# Patient Record
Sex: Male | Born: 1950 | Race: Black or African American | Hispanic: No | Marital: Married | Smoking: Former smoker
Health system: Southern US, Community
[De-identification: ages and names within clinical notes are randomized; demographics above are authoritative.]

## PROBLEM LIST (undated history)

## (undated) DIAGNOSIS — J45909 Unspecified asthma, uncomplicated: Secondary | ICD-10-CM

## (undated) DIAGNOSIS — E785 Hyperlipidemia, unspecified: Secondary | ICD-10-CM

## (undated) DIAGNOSIS — R002 Palpitations: Secondary | ICD-10-CM

## (undated) DIAGNOSIS — Z87442 Personal history of urinary calculi: Secondary | ICD-10-CM

## (undated) DIAGNOSIS — N189 Chronic kidney disease, unspecified: Secondary | ICD-10-CM

## (undated) DIAGNOSIS — K219 Gastro-esophageal reflux disease without esophagitis: Secondary | ICD-10-CM

## (undated) DIAGNOSIS — M1990 Unspecified osteoarthritis, unspecified site: Secondary | ICD-10-CM

## (undated) DIAGNOSIS — I10 Essential (primary) hypertension: Secondary | ICD-10-CM

## (undated) HISTORY — DX: Gastro-esophageal reflux disease without esophagitis: K21.9

## (undated) HISTORY — DX: Hyperlipidemia, unspecified: E78.5

## (undated) HISTORY — PX: TONSILLECTOMY: SUR1361

## (undated) HISTORY — PX: TONSILLECTOMY: SHX5217

## (undated) HISTORY — DX: Palpitations: R00.2

---

## 1998-12-16 ENCOUNTER — Encounter: Admission: RE | Admit: 1998-12-16 | Discharge: 1998-12-16 | Payer: Self-pay | Admitting: Gastroenterology

## 1998-12-30 ENCOUNTER — Encounter: Payer: Self-pay | Admitting: *Deleted

## 1998-12-30 ENCOUNTER — Encounter: Admission: RE | Admit: 1998-12-30 | Discharge: 1998-12-30 | Payer: Self-pay | Admitting: Pediatrics

## 1999-02-01 ENCOUNTER — Encounter: Payer: Self-pay | Admitting: *Deleted

## 1999-02-03 ENCOUNTER — Ambulatory Visit (HOSPITAL_COMMUNITY): Admission: RE | Admit: 1999-02-03 | Discharge: 1999-02-03 | Payer: Self-pay | Admitting: *Deleted

## 1999-02-03 ENCOUNTER — Encounter (INDEPENDENT_AMBULATORY_CARE_PROVIDER_SITE_OTHER): Payer: Self-pay | Admitting: Specialist

## 2000-07-04 ENCOUNTER — Encounter: Admission: RE | Admit: 2000-07-04 | Discharge: 2000-07-04 | Payer: Self-pay | Admitting: *Deleted

## 2000-07-04 ENCOUNTER — Encounter: Payer: Self-pay | Admitting: *Deleted

## 2000-07-05 ENCOUNTER — Encounter (INDEPENDENT_AMBULATORY_CARE_PROVIDER_SITE_OTHER): Payer: Self-pay | Admitting: Specialist

## 2000-07-05 ENCOUNTER — Ambulatory Visit (HOSPITAL_COMMUNITY): Admission: RE | Admit: 2000-07-05 | Discharge: 2000-07-05 | Payer: Self-pay | Admitting: *Deleted

## 2000-07-05 ENCOUNTER — Encounter: Payer: Self-pay | Admitting: *Deleted

## 2000-07-11 ENCOUNTER — Encounter: Payer: Self-pay | Admitting: *Deleted

## 2000-07-11 ENCOUNTER — Ambulatory Visit (HOSPITAL_COMMUNITY): Admission: RE | Admit: 2000-07-11 | Discharge: 2000-07-11 | Payer: Self-pay | Admitting: *Deleted

## 2000-07-22 ENCOUNTER — Encounter: Admission: RE | Admit: 2000-07-22 | Discharge: 2000-07-22 | Payer: Self-pay | Admitting: *Deleted

## 2000-07-22 ENCOUNTER — Encounter: Payer: Self-pay | Admitting: *Deleted

## 2000-07-30 ENCOUNTER — Encounter: Admission: RE | Admit: 2000-07-30 | Discharge: 2000-07-30 | Payer: Self-pay | Admitting: *Deleted

## 2000-07-30 ENCOUNTER — Encounter: Payer: Self-pay | Admitting: *Deleted

## 2000-11-20 ENCOUNTER — Emergency Department (HOSPITAL_COMMUNITY): Admission: EM | Admit: 2000-11-20 | Discharge: 2000-11-20 | Payer: Self-pay | Admitting: Emergency Medicine

## 2001-01-02 ENCOUNTER — Ambulatory Visit (HOSPITAL_COMMUNITY): Admission: RE | Admit: 2001-01-02 | Discharge: 2001-01-02 | Payer: Self-pay | Admitting: Family Medicine

## 2001-01-02 ENCOUNTER — Encounter: Payer: Self-pay | Admitting: Family Medicine

## 2002-05-06 ENCOUNTER — Ambulatory Visit (HOSPITAL_COMMUNITY): Admission: RE | Admit: 2002-05-06 | Discharge: 2002-05-06 | Payer: Self-pay | Admitting: Family Medicine

## 2002-05-06 ENCOUNTER — Encounter: Payer: Self-pay | Admitting: Family Medicine

## 2002-08-26 ENCOUNTER — Encounter (INDEPENDENT_AMBULATORY_CARE_PROVIDER_SITE_OTHER): Payer: Self-pay | Admitting: Specialist

## 2002-08-26 ENCOUNTER — Ambulatory Visit (HOSPITAL_COMMUNITY): Admission: RE | Admit: 2002-08-26 | Discharge: 2002-08-26 | Payer: Self-pay | Admitting: Gastroenterology

## 2002-12-21 ENCOUNTER — Encounter: Payer: Self-pay | Admitting: Gastroenterology

## 2002-12-21 ENCOUNTER — Ambulatory Visit (HOSPITAL_COMMUNITY): Admission: RE | Admit: 2002-12-21 | Discharge: 2002-12-21 | Payer: Self-pay | Admitting: Gastroenterology

## 2003-02-10 ENCOUNTER — Encounter: Admission: RE | Admit: 2003-02-10 | Discharge: 2003-02-10 | Payer: Self-pay | Admitting: Cardiology

## 2003-02-16 ENCOUNTER — Encounter: Admission: RE | Admit: 2003-02-16 | Discharge: 2003-02-16 | Payer: Self-pay | Admitting: Cardiology

## 2003-04-21 ENCOUNTER — Encounter (HOSPITAL_COMMUNITY): Admission: RE | Admit: 2003-04-21 | Discharge: 2003-05-21 | Payer: Self-pay | Admitting: Preventative Medicine

## 2004-03-30 ENCOUNTER — Ambulatory Visit (HOSPITAL_COMMUNITY): Admission: RE | Admit: 2004-03-30 | Discharge: 2004-03-30 | Payer: Self-pay | Admitting: Family Medicine

## 2005-11-16 ENCOUNTER — Ambulatory Visit (HOSPITAL_BASED_OUTPATIENT_CLINIC_OR_DEPARTMENT_OTHER): Admission: RE | Admit: 2005-11-16 | Discharge: 2005-11-16 | Payer: Self-pay | Admitting: Urology

## 2005-11-16 ENCOUNTER — Encounter (INDEPENDENT_AMBULATORY_CARE_PROVIDER_SITE_OTHER): Payer: Self-pay | Admitting: Specialist

## 2008-02-13 ENCOUNTER — Emergency Department (HOSPITAL_COMMUNITY): Admission: EM | Admit: 2008-02-13 | Discharge: 2008-02-13 | Payer: Self-pay | Admitting: Emergency Medicine

## 2009-03-05 HISTORY — PX: CYSTOSCOPY: SUR368

## 2009-11-10 ENCOUNTER — Encounter: Admission: RE | Admit: 2009-11-10 | Discharge: 2009-11-10 | Payer: Self-pay | Admitting: Otolaryngology

## 2010-01-10 ENCOUNTER — Ambulatory Visit (HOSPITAL_BASED_OUTPATIENT_CLINIC_OR_DEPARTMENT_OTHER): Admission: RE | Admit: 2010-01-10 | Discharge: 2010-01-10 | Payer: Self-pay | Admitting: Urology

## 2010-03-25 ENCOUNTER — Encounter: Payer: Self-pay | Admitting: Cardiology

## 2010-05-16 LAB — POCT I-STAT 4, (NA,K, GLUC, HGB,HCT)
Glucose, Bld: 125 mg/dL — ABNORMAL HIGH (ref 70–99)
HCT: 41 % (ref 39.0–52.0)
Potassium: 3.7 mEq/L (ref 3.5–5.1)
Sodium: 139 mEq/L (ref 135–145)

## 2010-07-21 NOTE — Op Note (Signed)
   NAME:  Reginald Stephens, Reginald Stephens                          ACCOUNT NO.:  [id number]   MEDICAL RECORD NO.:  [id number]                   PATIENT TYPE:  AMB   LOCATION:  ENDO                                 FACILITY:  [facility]   PHYSICIAN:  John C. Madilyn Fireman, M.D.                 DATE OF BIRTH:  [birth year]   DATE OF PROCEDURE:  08/26/2002  DATE OF DISCHARGE:                                 OPERATIVE REPORT   PROCEDURE:  Esophagogastroduodenoscopy.   INDICATION FOR PROCEDURE:  Weight loss, atypical reflux symptoms, and  continued dyspepsia.   PROCEDURE:  The patient was placed in the left lateral decubitus position  and placed on the pulse monitor with continuous low-flow oxygen delivered by  nasal cannula.  He was sedated with 75 mcg IV fentanyl and 6 mg IV Versed.  The Olympus video endoscope was advanced under direct vision into the  oropharynx and the esophagus.  The esophagus was straight and of normal  caliber with the squamocolumnar line at 38 cm.  There was no visible hiatal  hernia, ring, stricture, or other abnormality at the GE junction.  The  stomach was entered and a small amount of liquid secretions was suctioned  from the fundus.  Retroflexed view of the cardia was unremarkable.  The  fundus, body, antrum, and pylorus all appeared normal.  The duodenum was  entered and both the bulb and second portion were well-inspected and  appeared to be within normal limits.  Small bowel biopsies were taken to  rule out celiac disease.  The scope was withdrawn back into the antrum and a  CLOtest obtained.  The scope was then withdrawn and the patient returned to  the recovery room in stable condition.  He tolerated the procedure well, and  there were no immediate complications.   IMPRESSION:  Normal endoscopy.   PLAN:  Await CLOtest and small bowel biopsies, and will proceed with  colonoscopy.                                               John C. Madilyn Fireman, M.D.    JCH/MEDQ  D:  08/26/2002  T:   08/26/2002  Job:  [id number]

## 2010-07-21 NOTE — Op Note (Signed)
NAMEARLINGTON, Reginald Stephens                ACCOUNT NO.:  [id number]   MEDICAL RECORD NO.:  [id number]          PATIENT TYPE:  AMB   LOCATION:  NESC                         FACILITY:  [facility]   PHYSICIAN:  Lindaann Slough, M.D.  DATE OF BIRTH:  05/11/1950   DATE OF PROCEDURE:  11/16/2005  DATE OF DISCHARGE:                                 OPERATIVE REPORT   PREOPERATIVE DIAGNOSIS:  Hematuria.   POSTPROCEDURE DIAGNOSIS:  Hematuria.   PROCEDURE DONE:  Cystoscopy.   INDICATION:  The patient is a 60 year old male who has a history of urethral  stricture.  He was found on urinalysis to have microhematuria.  He had  cystoscopy and dilation in the past.  He is scheduled today for cystoscopy.  The patient did not want to have the procedure under local anesthesia.   Under general anesthesia, the patient was prepped and draped and placed in  the dorsal lithotomy position.  A #22 Wappler cystoscope was inserted in the  bladder.  The urethra is normal.  There is no stone or tumor in the bladder.  The ureteral orifices are in normal position and shape with clear efflux.  The bladder was irrigated with normal saline and bladder washings were sent  for cytology.  The cystoscope was then removed.   He tolerated the procedure well.      Lindaann Slough, M.D.  Electronically Signed     MN/MEDQ  D:  11/16/2005  T:  11/17/2005  Job:  [id number]

## 2010-07-21 NOTE — H&P (Signed)
[facility]  Patient:    Reginald Stephens, Reginald Stephens                       MRN: [id number] Adm. Date:  [id number] Attending:  Dalbert Mayotte CC:         Radene Knee., M.D. (office)  [facility]   History and Physical  CHIEF COMPLAINT: This 60 year old male is scheduled for shock wave lithotripsy on Jul 11, 2000 at [facility] at 0900 hours, with a chief complaint of blockage to the left kidney.  HISTORY OF PRESENT ILLNESS: This 60 year old male has been followed in our office since 1989 when he was initially seen with prostatitis and hematuria, and found to have severe posterior urethritis.  The patient was treated with antibiotics and did well until 1998 when he again had discoloration of his urine and was getting up several times a night.  Investigation at that time revealed that he had a 9 mm calculus in the left lower calix, nonobstructing. He had cystoscopy at that time which suggested hemorrhages and glomerulations in the bladder typical of interstitial cystitis.  The patient had Clorpactin and hydrodistention and this was repeated in December 2000, and the patient has done well clinically.  He came to the office for a routine follow-up check on Jul 04, 2000 and ultrasound revealed a grade 2 left hydronephrosis.  His urine showed slight increase in white blood cells, 5-10/hpf, with 1-2 rbc/hpf. He had no significant residual urine and no hydronephrosis on the right.  The patient had a cystoscopy and insertion of a left ureteral stent and retrograde pyelogram performed at [facility] on Jul 05, 2000 and this confirmed a 9-10 mm calculus obstructing the upper left ureter with grade 2 hydronephrosis.  The stent was in good position.  The patient was allowed to go home on Tylox and Cipro and scheduled for lithotripsy on Jul 11, 2000.  ALLERGIES: He thinks he is allergic to shellfish and possibly IODIDES,  no other drugs.  CURRENT MEDICATIONS:  1. Prinivil 20 mg q.d.  2. Cartia XT 300 mg q.d.  3. Glucotrol and Glucophage, which he was advised to stop.  PAST MEDICAL/SURGICAL HISTORY:  1. Asthma.  2. Diabetes mellitus.  3. Cystoscopy in 1989, 1998, and December 2000 with hydrodistention and IVP     in October 2000 when calculus was additionally noted.  4. CT scan performed on Jul 04, 2000 at [facility] confirmed     hydronephrosis and stone obstructing the left upper ureter.  He has had no major operations.  REVIEW OF SYSTEMS: General health has been good.  Weight has been stable. HEENT: Unremarkable.  He denies any headaches.  CARDIORESPIRATORY: He does have asthma, which is not bothering him at the present time.  He is under the care of [facility] for this.  He has borderline cardiomegaly on his chest x-ray of Jul 05, 2000.  The patient denies any chest pain, heart attack.  ENDOCRINE/GI: He has been diagnosed with adult onset diabetes as well as gastroesophageal reflux.  He has no history of hepatitis or peptic ulcer disease.  BONES/JOINTS/MUSCLES: Unremarkable.  NEUROLOGIC/PSYCHIATRIC: Unremarkable.  FAMILY HISTORY: Mother and father living and well, in their 6s.  No diabetes, stones, or bleeds in the family to his knowledge.  However, there is a family history of hypertension.  SOCIAL HISTORY: The patient is married.  He does not abuse  alcohol or tobacco. He is employed by [employer].  PHYSICAL EXAMINATION:  GENERAL: Well-developed, well-nourished 60 year old male.  VITAL SIGNS: Blood pressure 130/90, temperature 98.5 degrees, pulse 68, respirations 12.  HEENT: Ears and tympanic membranes unremarkable.  PERRLA.  EOMI.  Pharynx benign.  Teeth in good repair.  NECK: No enlargement of thyroid, no nodes palpable.  CHEST: Clear to auscultation and percussion.  HEART: Normal sinus rhythm, no murmur detected.  ABDOMEN: Obese.  Liver,  spleen, and kidneys - masses, tenderness, hernia not detected.  GU: The meatus is normal.  The penis is uncircumcised.  The left testis is normal size and consistency and the right testis is small but normal consistency.  No tenderness.  Scrotum, anus, and perineum normal.  Rectal tone good.  Prostate 20 g, symmetric, smooth, soft, nontender.  EXTREMITIES: No edema, good peripheral pulses.  NEUROLOGIC: Grossly normal reflexes and sensation.  IMPRESSION:  1. Left upper ureteral calculus, 9 mm.  2. Interstitial cystitis.  3. Prostatitis and benign prostatic hypertrophy.  4. Asthma.  5. Prostatic specific antigen 9.6.  6. Diabetes mellitus.  PLAN: The plan is for shock wave lithotripsy on Jul 11, 2000.  He has Tylox for pain, Cipro 500 mg b.i.d. for prophylaxis against infection. DD:  07/05/00 TD:  07/07/00 Job: [id number]

## 2010-07-21 NOTE — Op Note (Signed)
[facility]  Patient:    Reginald Stephens, Reginald Stephens                       MRN: [id number] Proc. Date: 07/05/00 Adm. Date:  [id number] Attending:  Dalbert Mayotte CC:         [facility]   Operative Report  PREOPERATIVE DIAGNOSES: 1. Left ureteral calculus with hydronephrosis. 2. Hypertension. 3. Interstitial cystitis.  POSTOPERATIVE DIAGNOSES: 1. Left ureteral calculus with hydronephrosis. 2. Hypertension. 3. Interstitial cystitis.  OPERATION PERFORMED:  Cystoscopy and insertion of a left ureteral stent (#6 Kwort).  DESCRIPTION OF PROCEDURE:  This 60 year old male was brought to the operating room and underwent successful induction of general anesthesia after receiving IV Cipro.  The patient was prepared and draped in the usual fashion in the lithotomy position.  The bladder was inspected with a #22 cystourethroscope using 70 and 12 degree lenses.  The patients bladder was filled to capacity. No stones, tumor, or ulcer was noted.  He had a mild hyperemic erythema initially.  Following decompression of the bladder, he developed hemorrhages that were submucosal and petechial but no cracking or bleeding.  His capacity was approximately 700 mL.  The patients ureteral orifices appeared normal, and the left ureteral orifice was intubated with a #5 open-ended catheter using an .038 Glidewire which was passed up to the stone in the upper left ureter.  Over the Guidewire was passed the open-ended catheter and with some manipulation, the Guidewire was passed beyond the stone and up into the kidney and the open-ended catheter advanced over the Guidewire.  A retrograde pyelogram was performed which revealed a grade 2 dilatation of the pelvis and calices on the left.  The open-ended catheter was then used to reintroduce the .038 Guidewire, and over the .038 Guidewire was passed a #6 Kwort double-J ureteral stent which was noted to be in good position.   A C-arm fluoroscopy in the kidney, and the distal end was noted to have a good curl by cystoscopy.  A pull-string was taped to the penis, and the patient returned to the recovery area in stable condition.  The plan is for the patient to go home.  We will give him Tylox for pain if he needs it.  We will give him Cipro 500 mg b.i.d. for prophylaxis against infection.  We will have him return to the office on Monday for scheduling of shock wave lithotripsy next week. DD:  07/05/00 TD:  07/05/00 Job: [id number]

## 2010-07-21 NOTE — Op Note (Signed)
   NAME:  Reginald Stephens, Reginald Stephens                          ACCOUNT NO.:  [id number]   MEDICAL RECORD NO.:  [id number]                   PATIENT TYPE:  AMB   LOCATION:  ENDO                                 [facility]ACILITY:  [facility]   PHYSICIAN:  John C. Madilyn Fireman, M.D.                 DATE OF BIRTH:  11/04/1950   DATE OF PROCEDURE:  08/26/2002  DATE OF DISCHARGE:                                 OPERATIVE REPORT   PROCEDURE:  Colonoscopy.   INDICATION FOR PROCEDURE:  Polyp seen on sigmoidoscopy.   PROCEDURE:  The patient was placed in the left lateral decubitus position  and placed on the pulse monitor with continuous low-flow oxygen delivered by  nasal cannula.  He was sedated with 25 mg IV fentanyl and 1.5 mg IV Versed  in addition to the medicine received for the previous EGD.  The Olympus  pediatric colonoscope was inserted into the rectum and advanced to its  entire length.  However, despite multiple torquing maneuvers, abdominal  pressure, and position changes, I could not reach the cecum with the scope  fully inserted.  The scope was withdrawn and replaced with a standard adult  colonoscope with the same result.  It was not clear but felt that the point  of most proximal visualization was somewhere around the hepatic flexure.  The prep was good.  The most proximal areas examined appeared normal, as did  the transverse, descending, and sigmoid colon.  There were no masses,  polyps, diverticula, or other mucosal abnormalities seen.  At about 15 cm  there was a 6-8 mm sessile polyp, which was fulgurated by hot biopsy, but  otherwise the rectum appeared normal.  The scope was then withdrawn and the  patient returned to the recovery room in stable condition.  He tolerated the  procedure well, and there were no immediate complications.   IMPRESSION:  Small rectal polyp, otherwise normal study with inability to  reach the cecum and parts of the ascending colon.   PLAN:  Await biopsy results to determine  method and interval for future  colon screening.                                               John C. Madilyn Fireman, M.D.    JCH/MEDQ  D:  08/26/2002  T:  08/26/2002  Job:  [id number]

## 2010-07-21 NOTE — H&P (Signed)
[facility]  Patient:    Reginald Stephens, Reginald Stephens                      MRN: [id number] Attending:  Radene Knee., M.D.                         History and Physical  REASON FOR ADMISSION:  This 60 year old male is scheduled at [facility] on Jul 05, 2000 for cystoscopy and insertion of a left ureteral stent for relief of hydronephrosis secondary to ureteral calculus.  PRESENT ILLNESS:  This 60 year old male has been followed in our office at irregular intervals since 1989 and recently came to the office on Jul 04, 2000 for routine followup visit and investigation revealed that he had 5 to 10 wbcs in the urine, 1 to 2 rbcs and ultrasound suggested a hydronephrosis on the left, which had not been present previously.  He was known, however, to have had a 10-mm calculus in the left kidney on previous studies and presumably this has moved into the upper ureter.  He is scheduled for x-rays and blood work in preparation for insertion of stent and preparation for ESWL. The patients present illness reveals that since 1993, he has had microscopic hematuria, he has had some difficulties with erections, gets up infrequently at night, has not seen any gross blood, gravel or stone, but in 1998, he did have hematuria and cystoscopy revealed glomerulations and submucosal hemorrhages, with a capacity of 500 to 700 cc, suggesting interstitial cystitis-type changes.  The cytologies at that time were benign and he has done well since his last CYSTO and hydrodistention, December 2000; at that time, his capacity was 700 cc.  ALLERGIES:  No known drug allergies.  PRESENT MEDICATIONS:  Viagra 50 to 100 mg before intercourse.  He takes Glucophage, Glucotrol, Cartia XT and a fluid pill, probably HCTZ.  PAST MEDICAL AND SURGICAL HISTORY:  He does have asthma, obesity and has had no major operations.  He had the cystoscopies in 1989, 1998 and in December 2000.  His IVP, October 2000,  revealed a 9-mm calculus in the left lower calyx with no obstruction at that time.  REVIEW OF SYSTEMS:  General health has been fairly good.  Weight has been an increasing problem.  HEENT:  Denies any headaches or problems with his eyes or ears.  CARDIORESPIRATORY:  He does have asthma, which is not bothering him at the present time.  He denies any chest pain or heart attack.  GI:  He has been diagnosed with adult-onset diabetes mellitus.  No hepatitis.  No peptic ulcer disease.  BONES, JOINTS AND MUSCLES:  Unremarkable.  NEUROPSYCHIATRIC: Unremarkable.  FAMILY HISTORY:  Mother and father are living and well in their early 26s. There is no diabetes, stones, bleeders in the family; there is, however, a family history of hypertension.  SOCIAL HISTORY:  The patient is married and does not abuse alcohol or tobacco. 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  PHYSICAL EXAMINATION:  GENERAL:  Physical examination reveals a well-developed, well-nourished 60 year old male in no acute distress.  VITAL SIGNS:  The temperature is 98.5, the pulse 68, the respirations 12, the blood pressure 130/90.  HEENT:  The ears and tympanic membranes are unremarkable.  Eyes react normally to light and accommodation.  Extraocular movements intact.  Pharynx benign. Teeth in good repair.  NECK:  No enlargement of the thyroid.  No nodes are palpable.  CHEST:  Clear to percussion and auscultation.  HEART:  Normal sinus rhythm.  No murmur detected.  ABDOMEN:  Markedly obese.  No masses or tenderness.  Liver, kidney, spleen, hernia not detected.  GENITALIA:  The penis is normal, uncircumcised, meatus normal.  The left testis is large.  The right testis is small.  Neither one is tender, good consistency.  Scrotum, anus, perineum normal.  RECTAL:  Rectal tone good.  Prostate is 20 g, symmetrical, smooth, soft.  EXTREMITIES:  No edema.  Good peripheral  pulses.  NEUROLOGIC:  Grossly normal reflexes and sensation.  IMPRESSION: 1. Left hydronephrosis, grade 2, secondary to ureteral calculus. 2. Prostatitis and benign prostatic hypertrophy. 3. Interstitial cystitis, quiescent. 4. Asthma. 5. Urinary tract infection, culture pending.  PLAN:  Cipro 500 mg b.i.d., CT stone protocol, CBC, CMET, PT, PTT and PSA. DD:  07/04/00 TD:  07/05/00 Job: [id number]

## 2010-11-23 ENCOUNTER — Telehealth (INDEPENDENT_AMBULATORY_CARE_PROVIDER_SITE_OTHER): Payer: Self-pay | Admitting: *Deleted

## 2010-11-23 NOTE — Telephone Encounter (Signed)
TCS sch'd 02/15/11 @ 8:30 (9:30), hold evening & morning dose of Metformin, no change to glipizide, movi prep instructions mailed

## 2010-12-08 LAB — BASIC METABOLIC PANEL
BUN: 16 mg/dL (ref 6–23)
CO2: 27 mEq/L (ref 19–32)
Chloride: 93 mEq/L — ABNORMAL LOW (ref 96–112)
Creatinine, Ser: 0.99 mg/dL (ref 0.4–1.5)
Glucose, Bld: 182 mg/dL — ABNORMAL HIGH (ref 70–99)
Potassium: 3.3 mEq/L — ABNORMAL LOW (ref 3.5–5.1)

## 2010-12-08 LAB — DIFFERENTIAL
Basophils Relative: 1 % (ref 0–1)
Eosinophils Absolute: 0 10*3/uL (ref 0.0–0.7)
Eosinophils Relative: 0 % (ref 0–5)
Monocytes Relative: 11 % (ref 3–12)
Neutrophils Relative %: 66 % (ref 43–77)

## 2010-12-08 LAB — CBC
HCT: 47 % (ref 39.0–52.0)
MCHC: 33.6 g/dL (ref 30.0–36.0)
MCV: 98.7 fL (ref 78.0–100.0)
Platelets: 396 10*3/uL (ref 150–400)

## 2010-12-08 LAB — GLUCOSE, CAPILLARY: Glucose-Capillary: 99 mg/dL (ref 70–99)

## 2010-12-14 ENCOUNTER — Encounter (INDEPENDENT_AMBULATORY_CARE_PROVIDER_SITE_OTHER): Payer: Self-pay | Admitting: Internal Medicine

## 2011-01-09 ENCOUNTER — Other Ambulatory Visit (INDEPENDENT_AMBULATORY_CARE_PROVIDER_SITE_OTHER): Payer: Self-pay | Admitting: *Deleted

## 2011-01-09 DIAGNOSIS — Z1211 Encounter for screening for malignant neoplasm of colon: Secondary | ICD-10-CM

## 2011-02-01 ENCOUNTER — Telehealth (INDEPENDENT_AMBULATORY_CARE_PROVIDER_SITE_OTHER): Payer: Self-pay | Admitting: *Deleted

## 2011-02-01 NOTE — Telephone Encounter (Signed)
PCP/Requesting MD:    Name: Reginald Stephens  DOB: 11/21/1950  Home Phone: [phone]      Procedure: TCS  Reason/Indication:  SCREENING  Has patient had this procedure before?  YES  If so, when, by whom and where?  68 YRS AGO  Is there a family history of colon cancer?  NO  Who?  What age when diagnosed?    Is patient diabetic?   YES      Does patient have prosthetic heart valve?  NO  Do you have a pacemaker?  NO  Has patient had joint replacement within last 12 months?  NO  Is patient on Coumadin, Plavix and/or Aspirin? YES  Medications: ASA 81 MG DAILY, DILTIAZEM 300 MG DAILY, METFORMIN 500 MG BID, GLIPIZIDE 2.5 MG DAILY, LISINOPRIL/HCTZ 20/25 MG DAILY, LIPITOR 20 MG DAILY, PRILOSEC DAILY, ZINC 150 MG DAILY, BENADRYL NIGHTLY  Allergies: NKDA  Pharmacy:   Medication Adjustment: ASA 2 DAYS,  HOLD EVENING & MORNING DOSE OF METFORMIN PER TERRI  Procedure date & time: 02/15/11 @ 8:30

## 2011-02-05 NOTE — Telephone Encounter (Signed)
agree

## 2011-02-13 ENCOUNTER — Encounter (HOSPITAL_COMMUNITY): Payer: Self-pay | Admitting: Pharmacy Technician

## 2011-02-14 MED ORDER — SODIUM CHLORIDE 0.45 % IV SOLN
Freq: Once | INTRAVENOUS | Status: AC
  Administered 2011-02-15: 1000 mL via INTRAVENOUS

## 2011-02-15 ENCOUNTER — Encounter (HOSPITAL_COMMUNITY)
Admission: RE | Disposition: A | Discharge status: Home / Self Care | Payer: Self-pay | Source: Ambulatory Visit | Attending: Internal Medicine

## 2011-02-15 ENCOUNTER — Ambulatory Visit (HOSPITAL_COMMUNITY)
Admission: RE | Admit: 2011-02-15 | Discharge: 2011-02-15 | Disposition: A | Discharge status: Home / Self Care | Payer: BC Managed Care – PPO | Source: Ambulatory Visit | Attending: Internal Medicine | Admitting: Internal Medicine

## 2011-02-15 ENCOUNTER — Other Ambulatory Visit (INDEPENDENT_AMBULATORY_CARE_PROVIDER_SITE_OTHER): Payer: Self-pay | Admitting: Internal Medicine

## 2011-02-15 ENCOUNTER — Encounter (HOSPITAL_COMMUNITY): Payer: Self-pay | Admitting: *Deleted

## 2011-02-15 DIAGNOSIS — D126 Benign neoplasm of colon, unspecified: Secondary | ICD-10-CM

## 2011-02-15 DIAGNOSIS — E119 Type 2 diabetes mellitus without complications: Secondary | ICD-10-CM | POA: Insufficient documentation

## 2011-02-15 DIAGNOSIS — K5730 Diverticulosis of large intestine without perforation or abscess without bleeding: Secondary | ICD-10-CM | POA: Insufficient documentation

## 2011-02-15 DIAGNOSIS — Z79899 Other long term (current) drug therapy: Secondary | ICD-10-CM | POA: Insufficient documentation

## 2011-02-15 DIAGNOSIS — I10 Essential (primary) hypertension: Secondary | ICD-10-CM | POA: Insufficient documentation

## 2011-02-15 DIAGNOSIS — Z1211 Encounter for screening for malignant neoplasm of colon: Secondary | ICD-10-CM

## 2011-02-15 HISTORY — DX: Chronic kidney disease, unspecified: N18.9

## 2011-02-15 HISTORY — DX: Unspecified osteoarthritis, unspecified site: M19.90

## 2011-02-15 HISTORY — PX: COLONOSCOPY: SHX5424

## 2011-02-15 HISTORY — DX: Essential (primary) hypertension: I10

## 2011-02-15 LAB — GLUCOSE, CAPILLARY: Glucose-Capillary: 135 mg/dL — ABNORMAL HIGH (ref 70–99)

## 2011-02-15 SURGERY — COLONOSCOPY
Anesthesia: Moderate Sedation

## 2011-02-15 MED ORDER — MIDAZOLAM HCL 5 MG/5ML IJ SOLN
INTRAMUSCULAR | Status: DC | PRN
  Administered 2011-02-15: 2 mg via INTRAVENOUS

## 2011-02-15 MED ORDER — MEPERIDINE HCL 50 MG/ML IJ SOLN
INTRAMUSCULAR | Status: AC
  Filled 2011-02-15: qty 1

## 2011-02-15 MED ORDER — MIDAZOLAM HCL 5 MG/5ML IJ SOLN
INTRAMUSCULAR | Status: AC
  Filled 2011-02-15: qty 10

## 2011-02-15 MED ORDER — MEPERIDINE HCL 25 MG/ML IJ SOLN
INTRAMUSCULAR | Status: DC | PRN
  Administered 2011-02-15: 25 mg via INTRAVENOUS

## 2011-02-15 MED ORDER — MEPERIDINE HCL 50 MG/ML IJ SOLN
INTRAMUSCULAR | Status: DC | PRN
  Administered 2011-02-15: 25 mg via INTRAVENOUS

## 2011-02-15 MED ORDER — STERILE WATER FOR IRRIGATION IR SOLN
Status: DC | PRN
  Administered 2011-02-15: 08:00:00

## 2011-02-15 NOTE — Op Note (Signed)
COLONOSCOPY PROCEDURE REPORT  PATIENT:  Reginald Stephens  MR#:  [id number] Birthdate:  March 02, 1951, 60 y.o., male Endoscopist:  Dr. Malissa Hippo, MD Referred By:  Lenise Herald, [facility] Procedure Date: 02/15/2011  Procedure:   Colonoscopy  Indications: Patient is 60 year old African American male who is here for average risk screening colonoscopy.  Informed Consent:  Procedure and risks were reviewed with the patient and informed consent was obtained.  Medications:  Demerol 50 mg IV Versed 4 mg IV  Description of procedure:  After a digital rectal exam was performed, that colonoscope was advanced from the anus through the rectum and colon to the area of the cecum, ileocecal valve and appendiceal orifice. The cecum was deeply intubated. These structures were well-seen and photographed for the record. From the level of the cecum and ileocecal valve, the scope was slowly and cautiously withdrawn. The mucosal surfaces were carefully surveyed utilizing scope tip to flexion to facilitate fold flattening as needed. The scope was pulled down into the rectum where a thorough exam including retroflexion was performed.  Findings:   Prep satisfactory. Redundant colon with few small diverticula at sigmoid colon. Two small polyps at hepatic flexure ablated via cold biopsy and submitted in one container. Normal rectal mucosa and anorectal junction.  Therapeutic/Diagnostic Maneuvers Performed:  See above  Complications:  None  Cecal Withdrawal Time:  21 minutes  Impression:  Examination performed to cecum.. Two small polyps ablated via cold biopsy from hepatic flexure and submitted in one container. Mild sigmoid colon diverticulosis.  Recommendations:  Standard instructions given. I will be contacting patient with results of biopsy and further recommendations.  REHMAN,NAJEEB U  02/15/2011 9:08 AM  CC: Lenise Herald, PAC

## 2011-02-15 NOTE — H&P (Signed)
Reginald Stephens is an 60 y.o. male.   Chief Complaint: Patient is here for colonoscopy. HPI: Patient is 60 year old African male who is in for average risk screening colonoscopy. Patient's last exam was 10 years ago and was normal. He has good appetite. He denies abdominal pain melena or rectal bleeding he says this soreness his abdomen since taking second part of his prep. Family history is negative for CRC. Past Medical History  Diagnosis Date  . Diabetes mellitus   . Hypertension   . Arthritis   . Chronic kidney disease     Past Surgical History  Procedure Date  . Tonsillectomy     age 11  . Cystoscopy 2011  . Tonsillectomy     History reviewed. No pertinent family history. Social History:  reports that he has been smoking.  He does not have any smokeless tobacco history on file. He reports that he drinks about 7.2 ounces of alcohol per week. He reports that he does not use illicit drugs.  Allergies:  Allergies  Allergen Reactions  . Shellfish Allergy     Medications Prior to Admission  Medication Dose Route Frequency Provider Last Rate Last Dose  . 0.45 % sodium chloride infusion   Intravenous Once Malissa Hippo, MD 20 mL/hr at 02/15/11 0736 1,000 mL at 02/15/11 0736  . meperidine (DEMEROL) 50 MG/ML injection           . midazolam (VERSED) 5 MG/5ML injection           . simethicone susp in sterile water 1000 mL irrigation    PRN Malissa Hippo, MD       No current outpatient prescriptions on file as of 02/15/2011.    Results for orders placed during the hospital encounter of 02/15/11 (from the past 48 hour(s))  GLUCOSE, CAPILLARY     Status: Abnormal   Collection Time   02/15/11  7:12 AM      Component Value Range Comment   Glucose-Capillary 135 (*) 70 - 99 (mg/dL)    No results found.  Review of Systems  Constitutional: Negative for weight loss.  Gastrointestinal: Negative for abdominal pain, diarrhea, constipation, blood in stool and melena.    Blood  pressure 127/84, pulse 48, temperature 98.1 F (36.7 C), temperature source Oral, resp. rate 20, height 5\' 10"  (1.778 m), weight 226 lb (102.513 kg), SpO2 98.00%. Physical Exam  Constitutional: He appears well-developed and well-nourished.  HENT:  Mouth/Throat: Oropharynx is clear and moist.  Eyes: Conjunctivae are normal. No scleral icterus.  Neck: No thyromegaly present.  Cardiovascular: Normal rate, regular rhythm and normal heart sounds.   Respiratory: Effort normal and breath sounds normal.  GI: Soft. He exhibits no distension and no mass. There is no tenderness.  Musculoskeletal: He exhibits no edema.  Lymphadenopathy:    He has no cervical adenopathy.  Neurological: He is alert.  Skin: Skin is warm and dry.     Assessment/Plan Average risk screening colonoscopy.  Valerian Jewel U 02/15/2011, 8:28 AM

## 2011-02-23 ENCOUNTER — Encounter (INDEPENDENT_AMBULATORY_CARE_PROVIDER_SITE_OTHER): Payer: Self-pay | Admitting: *Deleted

## 2011-03-02 ENCOUNTER — Encounter (HOSPITAL_COMMUNITY): Payer: Self-pay | Admitting: Internal Medicine

## 2012-08-13 ENCOUNTER — Ambulatory Visit: Payer: BC Managed Care – PPO | Admitting: Cardiovascular Disease

## 2012-08-25 ENCOUNTER — Ambulatory Visit (INDEPENDENT_AMBULATORY_CARE_PROVIDER_SITE_OTHER): Payer: BC Managed Care – PPO | Admitting: Cardiovascular Disease

## 2012-08-25 ENCOUNTER — Encounter: Payer: Self-pay | Admitting: Cardiovascular Disease

## 2012-08-25 VITALS — BP 118/72 | HR 87 | Ht 70.0 in | Wt 225.0 lb

## 2012-08-25 DIAGNOSIS — Z720 Tobacco use: Secondary | ICD-10-CM | POA: Insufficient documentation

## 2012-08-25 DIAGNOSIS — E785 Hyperlipidemia, unspecified: Secondary | ICD-10-CM

## 2012-08-25 DIAGNOSIS — I10 Essential (primary) hypertension: Secondary | ICD-10-CM

## 2012-08-25 DIAGNOSIS — R002 Palpitations: Secondary | ICD-10-CM

## 2012-08-25 DIAGNOSIS — E119 Type 2 diabetes mellitus without complications: Secondary | ICD-10-CM

## 2012-08-25 DIAGNOSIS — F17200 Nicotine dependence, unspecified, uncomplicated: Secondary | ICD-10-CM

## 2012-08-25 NOTE — Assessment & Plan Note (Signed)
On statin drugs all by his primary care physician

## 2012-08-25 NOTE — Patient Instructions (Addendum)
Your physician wants you to follow-up as needed.  You will receive a reminder letter in the mail two months in advance. If you don't receive a letter, please call our office to schedule the follow-up appointment.  

## 2012-08-25 NOTE — Assessment & Plan Note (Signed)
I have counseled him about the importance of smoking cessation

## 2012-08-25 NOTE — Progress Notes (Signed)
08/25/2012 Reginald Stephens   [birth year]  [id number]  Primary Physician Lenise Herald, PA-C Primary Cardiologist: Runell Gess MD Roseanne Reno   HPI:  Reginald Stephens is a 62 year old moderately overweight married African American male father of 2, grandfather to 4 grandchildren referred by [facility] [employer] [facility] in retail for evaluation of an auscultated  Murmur.his cardiac risk factors are notable for diabetes, hypertension and hyperlipidemia. He does smoke one pack per day and has a 45-pack-year history of tobacco abuse. There is no family history of heart disease. He's never had a heart attack or stroke and denies chest pain or shortness of breath. He may have reactive airways disease. He saw a Surveyor, mining at [facility] medical who apparently auscultated a murmur referred him here for further evaluation.   Current Outpatient Prescriptions  Medication Sig Dispense Refill  . aspirin EC 81 MG tablet Take 81 mg by mouth daily.        Marland Kitchen atorvastatin (LIPITOR) 20 MG tablet Take 20 mg by mouth every morning.        . diltiazem (CARTIA XT) 300 MG 24 hr capsule Take 300 mg by mouth daily.        . diphenhydrAMINE (BENADRYL DYE-FREE ALLERGY) 25 mg capsule Take 25 mg by mouth at bedtime.        Marland Kitchen glipiZIDE (GLUCOTROL XL) 2.5 MG 24 hr tablet Take 2.5 mg by mouth daily.        Marland Kitchen lisinopril-hydrochlorothiazide (PRINZIDE,ZESTORETIC) 20-25 MG per tablet Take 1 tablet by mouth daily.       . metFORMIN (GLUCOPHAGE) 1000 MG tablet Take 1,000 mg by mouth 2 (two) times daily with a meal.        . Omeprazole Magnesium (PRILOSEC OTC PO) Take 20 mg by mouth daily.         No current facility-administered medications for this visit.    Allergies  Allergen Reactions  . Shellfish Allergy     History   Social History  . Marital Status: Married    Spouse Name: N/A    Number of Children: N/A  . Years of Education: N/A   Occupational History  . Not on file.   Social History  Main Topics  . Smoking status: Current Every Day Smoker -- 1.00 packs/day    Types: Cigarettes  . Smokeless tobacco: Not on file  . Alcohol Use: 7.2 oz/week    12 Cans of beer per week  . Drug Use: No  . Sexually Active: Not on file   Other Topics Concern  . Not on file   Social History Narrative  . No narrative on file     Review of Systems: General: negative for chills, fever, night sweats or weight changes.  Cardiovascular: negative for chest pain, dyspnea on exertion, edema, orthopnea, palpitations, paroxysmal nocturnal dyspnea or shortness of breath Dermatological: negative for rash Respiratory: negative for cough or wheezing Urologic: negative for hematuria Abdominal: negative for nausea, vomiting, diarrhea, bright red blood per rectum, melena, or hematemesis Neurologic: negative for visual changes, syncope, or dizziness All other systems reviewed and are otherwise negative except as noted above.    Blood pressure 118/72, pulse 87, height 5\' 10"  (1.778 m), weight 225 lb (102.059 kg).  General appearance: alert and no distress Neck: no adenopathy, no carotid bruit, no JVD, supple, symmetrical, trachea midline and thyroid not enlarged, symmetric, no tenderness/mass/nodules Lungs: clear to auscultation bilaterally Heart: regular rate and rhythm, S1, S2 normal, no murmur, click, rub or  gallop Abdomen: soft, non-tender; bowel sounds normal; no masses,  no organomegaly Extremities: extremities normal, atraumatic, no cyanosis or edema Pulses: 2+ and symmetric  EKG normal sinus rhythm at 87 without ST or T wave changes  ASSESSMENT AND PLAN:   Essential hypertension Under good control on current medications  Hyperlipidemia On statin drugs all by his primary care physician  Tobacco abuse I have counseled him about the importance of smoking cessation      Runell Gess MD [facility], [facility] 08/25/2012 10:56 AM

## 2012-08-25 NOTE — Assessment & Plan Note (Signed)
Under good control on current medications 

## 2013-05-10 ENCOUNTER — Emergency Department (HOSPITAL_COMMUNITY): Payer: BC Managed Care – PPO

## 2013-05-10 ENCOUNTER — Encounter (HOSPITAL_COMMUNITY): Payer: Self-pay | Admitting: Emergency Medicine

## 2013-05-10 ENCOUNTER — Emergency Department (HOSPITAL_COMMUNITY)
Admission: EM | Admit: 2013-05-10 | Discharge: 2013-05-10 | Disposition: A | Discharge status: Home / Self Care | Payer: BC Managed Care – PPO | Attending: Emergency Medicine | Admitting: Emergency Medicine

## 2013-05-10 DIAGNOSIS — M129 Arthropathy, unspecified: Secondary | ICD-10-CM | POA: Insufficient documentation

## 2013-05-10 DIAGNOSIS — IMO0002 Reserved for concepts with insufficient information to code with codable children: Secondary | ICD-10-CM | POA: Insufficient documentation

## 2013-05-10 DIAGNOSIS — F17200 Nicotine dependence, unspecified, uncomplicated: Secondary | ICD-10-CM | POA: Insufficient documentation

## 2013-05-10 DIAGNOSIS — Z79899 Other long term (current) drug therapy: Secondary | ICD-10-CM | POA: Insufficient documentation

## 2013-05-10 DIAGNOSIS — Z87442 Personal history of urinary calculi: Secondary | ICD-10-CM | POA: Insufficient documentation

## 2013-05-10 DIAGNOSIS — I129 Hypertensive chronic kidney disease with stage 1 through stage 4 chronic kidney disease, or unspecified chronic kidney disease: Secondary | ICD-10-CM | POA: Insufficient documentation

## 2013-05-10 DIAGNOSIS — Z7982 Long term (current) use of aspirin: Secondary | ICD-10-CM | POA: Insufficient documentation

## 2013-05-10 DIAGNOSIS — M549 Dorsalgia, unspecified: Secondary | ICD-10-CM

## 2013-05-10 DIAGNOSIS — N189 Chronic kidney disease, unspecified: Secondary | ICD-10-CM | POA: Insufficient documentation

## 2013-05-10 DIAGNOSIS — Y929 Unspecified place or not applicable: Secondary | ICD-10-CM | POA: Insufficient documentation

## 2013-05-10 DIAGNOSIS — X500XXA Overexertion from strenuous movement or load, initial encounter: Secondary | ICD-10-CM | POA: Insufficient documentation

## 2013-05-10 DIAGNOSIS — E785 Hyperlipidemia, unspecified: Secondary | ICD-10-CM | POA: Insufficient documentation

## 2013-05-10 DIAGNOSIS — Y939 Activity, unspecified: Secondary | ICD-10-CM | POA: Insufficient documentation

## 2013-05-10 DIAGNOSIS — E119 Type 2 diabetes mellitus without complications: Secondary | ICD-10-CM | POA: Insufficient documentation

## 2013-05-10 LAB — BASIC METABOLIC PANEL
BUN: 14 mg/dL (ref 6–23)
CHLORIDE: 104 meq/L (ref 96–112)
CO2: 26 meq/L (ref 19–32)
CREATININE: 0.69 mg/dL (ref 0.50–1.35)
Calcium: 9.4 mg/dL (ref 8.4–10.5)
GFR calc Af Amer: >90 mL/min (ref >90)
GFR calc non Af Amer: >90 mL/min (ref >90)
Glucose, Bld: 102 mg/dL — ABNORMAL HIGH (ref 70–99)
POTASSIUM: 4.1 meq/L (ref 3.7–5.3)
Sodium: 140 mEq/L (ref 137–147)

## 2013-05-10 LAB — URINALYSIS, ROUTINE W REFLEX MICROSCOPIC
BILIRUBIN URINE: NEGATIVE
GLUCOSE, UA: NEGATIVE mg/dL
KETONES UR: NEGATIVE mg/dL
Leukocytes, UA: NEGATIVE
Nitrite: NEGATIVE
PH: 6 (ref 5.0–8.0)
Protein, ur: NEGATIVE mg/dL
Specific Gravity, Urine: 1.025 (ref 1.005–1.030)
Urobilinogen, UA: 0.2 mg/dL (ref 0.0–1.0)

## 2013-05-10 LAB — URINE MICROSCOPIC-ADD ON

## 2013-05-10 MED ORDER — HYDROCODONE-ACETAMINOPHEN 5-325 MG PO TABS
1.0000 | ORAL_TABLET | Freq: Once | ORAL | Status: AC
  Administered 2013-05-10: 1 via ORAL
  Filled 2013-05-10: qty 1

## 2013-05-10 MED ORDER — OXYCODONE-ACETAMINOPHEN 5-325 MG PO TABS: 1.0000 | ORAL_TABLET | Freq: Four times a day (QID) | ORAL | Status: DC | PRN

## 2013-05-10 MED ORDER — IBUPROFEN 400 MG PO TABS: 400.0000 mg | ORAL_TABLET | Freq: Four times a day (QID) | ORAL | Status: DC | PRN

## 2013-05-10 NOTE — Discharge Instructions (Signed)
Back Pain, Adult Low back pain is very common. About 1 in 5 people have back pain.The cause of low back pain is rarely dangerous. The pain often gets better over time.About half of people with a sudden onset of back pain feel better in just 2 weeks. About 8 in 10 people feel better by 6 weeks.   You were evaluated today for back pain. Suspect musculoskeletal back pain following injury. However, you do have a small amount of hematuria and with your history of kidney stones, this is a another consideration. You should followup with her primary care doctor and urologist. Pain medication as prescribed. Limit NSAID use to 3-5 days. You should return if he has any new or worsening symptoms. CAUSES Some common causes of back pain include:  Strain of the muscles or ligaments supporting the spine.  Wear and tear (degeneration) of the spinal discs.  Arthritis.  Direct injury to the back. DIAGNOSIS Most of the time, the direct cause of low back pain is not known.However, back pain can be treated effectively even when the exact cause of the pain is unknown.Answering your caregiver's questions about your overall health and symptoms is one of the most accurate ways to make sure the cause of your pain is not dangerous. If your caregiver needs more information, he or she may order lab work or imaging tests (X-rays or MRIs).However, even if imaging tests show changes in your back, this usually does not require surgery. HOME CARE INSTRUCTIONS For many people, back pain returns.Since low back pain is rarely dangerous, it is often a condition that people can learn to [facility] their own.   Remain active. It is stressful on the back to sit or stand in one place. Do not sit, drive, or stand in one place for more than 30 minutes at a time. Take short walks on level surfaces as soon as pain allows.Try to increase the length of time you walk each day.  Do not stay in bed.Resting more than 1 or 2 days can delay  your recovery.  Do not avoid exercise or work.Your body is made to move.It is not dangerous to be active, even though your back may hurt.Your back will likely heal faster if you return to being active before your pain is gone.  Pay attention to your body when you bend and lift. Many people have less discomfortwhen lifting if they bend their knees, keep the load close to their bodies,and avoid twisting. Often, the most comfortable positions are those that put less stress on your recovering back.  Find a comfortable position to sleep. Use a firm mattress and lie on your side with your knees slightly bent. If you lie on your back, put a pillow under your knees.  Only take over-the-counter or prescription medicines as directed by your caregiver. Over-the-counter medicines to reduce pain and inflammation are often the most helpful.Your caregiver may prescribe muscle relaxant drugs.These medicines help dull your pain so you can more quickly return to your normal activities and healthy exercise.  Put ice on the injured area.  Put ice in a plastic bag.  Place a towel between your skin and the bag.  Leave the ice on for 15-20 minutes, 03-04 times a day for the first 2 to 3 days. After that, ice and heat may be alternated to reduce pain and spasms.  Ask your caregiver about trying back exercises and gentle massage. This may be of some benefit.  Avoid feeling anxious or stressed.Stress increases muscle  tension and can worsen back pain.It is important to recognize when you are anxious or stressed and learn ways to manage it.Exercise is a great option. SEEK MEDICAL CARE IF:  You have pain that is not relieved with rest or medicine.  You have pain that does not improve in 1 week.  You have new symptoms.  You are generally not feeling well. SEEK IMMEDIATE MEDICAL CARE IF:   You have pain that radiates from your back into your legs.  You develop new bowel or bladder control  problems.  You have unusual weakness or numbness in your arms or legs.  You develop nausea or vomiting.  You develop abdominal pain.  You feel faint. Document Released: 02/19/2005 Document Revised: 08/21/2011 Document Reviewed: 07/10/2010 [facility] Patient Information 2014 [place], [place].

## 2013-05-10 NOTE — ED Notes (Addendum)
Patient c/o left lower back pain x1 week that is progressively getting worse. Per patient hx of kidney stones. Denies any urinary symptoms or blood in urine. Patient also report incident 2 weeks ago with snow that patient fell backwards "putting \ back in unusual position but never fell on ground." Per patient worse with movement and laying on left side. Patient states "I can't bend over to tie my shoe without it catching."

## 2013-05-10 NOTE — ED Provider Notes (Signed)
CSN: [id number]     Arrival date & time 05/10/13  1038 History   First MD Initiated Contact with Patient 05/10/13 1059     Chief Complaint  Patient presents with  . Back Pain     (Consider location/radiation/quality/duration/timing/severity/associated sxs/prior Treatment) HPI  This is a 63 year old male with a history of diabetes, hypertension, and chronic kidney disease who presents with left lower back pain. Patient reports progressive worsening of left lower back pain.  Patient reports that he slipped on the snow several weeks ago and "twisted my back." He states that since that time he has had increasing pain with movement.  Patient also reports pain when going from sitting to standing or when bending over tying his shoes. He states "I'm not much for pain medications" that he has not taken anything at home.  Current pain is 5/10. He denies any weakness, numbness, or tingling of the lower extremities. He denies any bowel or bladder problems. Patient also has a history of kidney stones. He is followed closely by his urologist. He denies any urinary symptoms or hematuria.  Past Medical History  Diagnosis Date  . Diabetes mellitus   . Hypertension   . Arthritis   . Chronic kidney disease   . Hyperlipidemia    Past Surgical History  Procedure Laterality Date  . Tonsillectomy      age 15  . Cystoscopy  2011  . Tonsillectomy    . Colonoscopy  02/15/2011    Procedure: COLONOSCOPY;  Surgeon: Rogene Houston, MD;  Location: [facility];  Service: Endoscopy;  Laterality: N/A;   Family History  Problem Relation Age of Onset  . Heart attack Maternal Grandmother   . Stroke Maternal Grandfather    History  Substance Use Topics  . Smoking status: Current Every Day Smoker -- 1.00 packs/day for 25 years    Types: Cigarettes  . Smokeless tobacco: Never Used  . Alcohol Use: 7.2 oz/week    12 Cans of beer per week    Review of Systems  Constitutional: Negative.  Negative for fever.   Respiratory: Negative.  Negative for chest tightness and shortness of breath.   Cardiovascular: Negative.  Negative for chest pain.  Gastrointestinal: Negative.  Negative for abdominal pain.  Genitourinary: Negative.  Negative for dysuria and hematuria.  Musculoskeletal: Positive for back pain. Negative for neck pain.  Skin: Negative for rash.  Neurological: Negative for weakness, numbness and headaches.  All other systems reviewed and are negative.      Allergies  Shellfish allergy  Home Medications   Current Outpatient Rx  Name  Route  Sig  Dispense  Refill  . aspirin EC 81 MG tablet   Oral   Take 81 mg by mouth daily.           Marland Kitchen atorvastatin (LIPITOR) 20 MG tablet   Oral   Take 20 mg by mouth every morning.           . diltiazem (CARTIA XT) 300 MG 24 hr capsule   Oral   Take 300 mg by mouth daily.           . diphenhydrAMINE (BENADRYL DYE-FREE ALLERGY) 25 mg capsule   Oral   Take 25 mg by mouth at bedtime.           Marland Kitchen glipiZIDE (GLUCOTROL XL) 2.5 MG 24 hr tablet   Oral   Take 2.5 mg by mouth daily.           Marland Kitchen  Iron-Vitamins (GERITOL PO)   Oral   Take 1 tablet by mouth daily.         Marland Kitchen lisinopril-hydrochlorothiazide (PRINZIDE,ZESTORETIC) 20-25 MG per tablet   Oral   Take 1 tablet by mouth daily.          . metFORMIN (GLUCOPHAGE) 1000 MG tablet   Oral   Take 1,000 mg by mouth 2 (two) times daily with a meal.           . Omeprazole Magnesium (PRILOSEC OTC PO)   Oral   Take 20 mg by mouth daily.           Marland Kitchen ibuprofen (ADVIL,MOTRIN) 400 MG tablet   Oral   Take 1 tablet (400 mg total) by mouth every 6 (six) hours as needed. Limit usage to 3-5 days.   30 tablet   0   . oxyCODONE-acetaminophen (PERCOCET/ROXICET) 5-325 MG per tablet   Oral   Take 1 tablet by mouth every 6 (six) hours as needed for severe pain.   15 tablet   0    BP 141/74  Pulse 72  Temp(Src) 98.1 F (36.7 C) (Oral)  Resp 18  Ht 5\' 10"  (1.778 m)  Wt 232 lb  (105.235 kg)  BMI 33.29 kg/m2  SpO2 98% Physical Exam  Nursing note and vitals reviewed. Constitutional: He is oriented to person, place, and time. He appears well-developed and well-nourished. No distress.  HENT:  Head: Normocephalic and atraumatic.  Cardiovascular: Normal rate, regular rhythm and normal heart sounds.   No murmur heard. Pulmonary/Chest: Effort normal and breath sounds normal. No respiratory distress. He has no wheezes.  Abdominal: Soft. There is no tenderness.  Musculoskeletal: He exhibits no edema.  Tenderness palpation of the left lateral paraspinous muscles, no muscle spasm noted, no midline lumbar tenderness, no CVA tenderness  Lymphadenopathy:    He has no cervical adenopathy.  Neurological: He is alert and oriented to person, place, and time.  5/5 strength in all 4 extremities, normal gait  Skin: Skin is warm and dry.  Psychiatric: He has a normal mood and affect.    ED Course  Procedures (including critical care time) Labs Review Labs Reviewed  URINALYSIS, ROUTINE W REFLEX MICROSCOPIC - Abnormal; Notable for the following:    APPearance HAZY (*)    Hgb urine dipstick MODERATE (*)    All other components within normal limits  BASIC METABOLIC PANEL - Abnormal; Notable for the following:    Glucose, Bld 102 (*)    All other components within normal limits  URINE MICROSCOPIC-ADD ON   Imaging Review Dg Lumbar Spine Complete  05/10/2013   CLINICAL DATA:  Low back pain.  EXAM: LUMBAR SPINE - COMPLETE 4+ VIEW  COMPARISON:  DG ABDOMEN 1V dated 01/04/2010  FINDINGS: AP, lateral and oblique images of the lumbar spine were obtained. Mild degenerative endplate changes. Mild disc space loss at L5-S1. Negative for an acute fracture. No evidence for a pars defect. Nonspecific bowel gas pattern.  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 No acute bone abnormality.   Electronically Signed   By: Markus Daft M.D.   On: 05/10/2013 11:55     EKG  Interpretation None      MDM   Final diagnoses:  Back pain    Patient presents with back pain. He is nontoxic and nonfocal on exam. He does have tenderness to palpation over the left lumbar paraspinous muscles. There is no step off or deformity. Lab work was obtained and is  unremarkable. Urine shows a small amount of hematuria but the patient states that this is not uncommon for him. I suspect patient's pain is related to musculoskeletal strain.  However, kidney stones are also a consideration. Patient will be given pain medication. He is to followup with his primary Dr. as well as his urologist if symptoms persist. Patient was given strict return precautions.  After history, exam, and medical workup I feel the patient has been appropriately medically screened and is safe for discharge home. Pertinent diagnoses were discussed with the patient. Patient was given return precautions.     Merryl Hacker, MD 05/10/13 [phone]

## 2014-06-17 ENCOUNTER — Other Ambulatory Visit (INDEPENDENT_AMBULATORY_CARE_PROVIDER_SITE_OTHER): Payer: Self-pay | Admitting: *Deleted

## 2014-06-17 ENCOUNTER — Ambulatory Visit (INDEPENDENT_AMBULATORY_CARE_PROVIDER_SITE_OTHER): Payer: BLUE CROSS/BLUE SHIELD | Admitting: Internal Medicine

## 2014-06-17 ENCOUNTER — Encounter (INDEPENDENT_AMBULATORY_CARE_PROVIDER_SITE_OTHER): Payer: Self-pay | Admitting: Internal Medicine

## 2014-06-17 ENCOUNTER — Encounter (INDEPENDENT_AMBULATORY_CARE_PROVIDER_SITE_OTHER): Payer: Self-pay | Admitting: *Deleted

## 2014-06-17 VITALS — BP 152/72 | HR 80 | Temp 98.0°F | Ht 70.0 in | Wt 235.5 lb

## 2014-06-17 DIAGNOSIS — K219 Gastro-esophageal reflux disease without esophagitis: Secondary | ICD-10-CM

## 2014-06-17 DIAGNOSIS — R1310 Dysphagia, unspecified: Secondary | ICD-10-CM

## 2014-06-17 DIAGNOSIS — R1314 Dysphagia, pharyngoesophageal phase: Secondary | ICD-10-CM

## 2014-06-17 NOTE — Patient Instructions (Signed)
The risks and benefits such as perforation, bleeding, and infection were reviewed with the patient and is agreeable. 

## 2014-06-17 NOTE — Progress Notes (Signed)
Subjective:    Patient ID: Reginald Stephens, male    DOB: Mar 10, 1950, 64 y.o.   MRN: [id number]  HPI Referred b Dr. Jamesetta Geralds for GERD.  Presents today with  C/o that he has nasal drainage to run down his throat.  He says foods feel like they are lodging in his upper esophagus. He tells me has a hx of hiatal hernia. He sleeps with his head elevated because if he doesn't he will strangle.  He says even coffee feels like it lodges. He admits that if he eats late at night his acid reflux is worse.  Takes the Omeprazole 40mg  twice a day.  There has been no recent weight loss.  Appetite is good. Denies any epigastric pain.  Recently evaluated by Dr. Benjamine Mola (see below).  Presently using a nasal steroid (he did not bring) daily for nasal congestion.  BMs are normal. No melena or BRRB.   05/25/2014 Flexible Nasal Endoscopy: Chronic rhinitis without evidence of acute sinusitis. No purulent drainage, polyps, or other suspicious mass or lesion is noted on today's nasal endoscopy. Left nasal septal deviation with bilateral inferior turbinate hypertrophy. Mild posterior laryngeal edema is noted, consistent with his hx of hiatal hernia and GERD.  08/26/2002 EGD, Dr. Amedeo Plenty: wt loss, atypical  Reflux symptoms, dyspepsia,  Normal exam.   Review of Systems Married. Works at [employer]. Two sons in good health    Past Medical History  Diagnosis Date  . Diabetes mellitus   . Hypertension   . Arthritis   . Chronic kidney disease   . Hyperlipidemia     Past Surgical History  Procedure Laterality Date  . Tonsillectomy      age 50  . Cystoscopy  2011  . Tonsillectomy    . Colonoscopy  02/15/2011    Procedure: COLONOSCOPY;  Surgeon: Rogene Houston, MD;  Location: [facility];  Service: Endoscopy;  Laterality: N/A;    Allergies  Allergen Reactions  . Shellfish Allergy     Current Outpatient Prescriptions on File Prior to Visit  Medication Sig Dispense Refill  . aspirin EC 81 MG tablet Take  81 mg by mouth daily.      Marland Kitchen atorvastatin (LIPITOR) 20 MG tablet Take 20 mg by mouth every morning.      . diltiazem (CARTIA XT) 300 MG 24 hr capsule Take 300 mg by mouth daily.      . diphenhydrAMINE (BENADRYL DYE-FREE ALLERGY) 25 mg capsule Take 25 mg by mouth at bedtime.      Marland Kitchen glipiZIDE (GLUCOTROL XL) 2.5 MG 24 hr tablet Take 2.5 mg by mouth daily.      Marland Kitchen ibuprofen (ADVIL,MOTRIN) 400 MG tablet Take 1 tablet (400 mg total) by mouth every 6 (six) hours as needed. Limit usage to 3-5 days. 30 tablet 0  . lisinopril-hydrochlorothiazide (PRINZIDE,ZESTORETIC) 20-25 MG per tablet Take 1 tablet by mouth daily.     . metFORMIN (GLUCOPHAGE) 1000 MG tablet Take 1,000 mg by mouth 2 (two) times daily with a meal.      . Omeprazole Magnesium (PRILOSEC OTC PO) Take 20 mg by mouth daily.       No current facility-administered medications on file prior to visit.     Objective:   Physical ExamBlood pressure 152/72, pulse 80, temperature 98 F (36.7 C), height 5\' 10"  (1.778 m), weight 235 lb 8 oz (106.822 kg).  Alert and oriented. Skin warm and dry. Oral mucosa is moist.   . Sclera anicteric,  conjunctivae is pink. Thyroid not enlarged. No cervical lymphadenopathy. Lungs clear. Heart regular rate and rhythm.  Abdomen is soft. Bowel sounds are positive. No hepatomegaly. No abdominal masses felt. No tenderness.  No edema to lower extremities.         Assessment & Plan:  GERD/ possible dysphagia. PUD needs to be ruled out. Will schedule an EGD/ED with Dr. Laural Golden.

## 2014-07-08 ENCOUNTER — Encounter (HOSPITAL_COMMUNITY): Payer: Self-pay

## 2014-07-08 ENCOUNTER — Ambulatory Visit (HOSPITAL_COMMUNITY)
Admission: RE | Admit: 2014-07-08 | Discharge: 2014-07-08 | Disposition: A | Discharge status: Home / Self Care | Payer: BLUE CROSS/BLUE SHIELD | Source: Ambulatory Visit | Attending: Internal Medicine | Admitting: Internal Medicine

## 2014-07-08 ENCOUNTER — Encounter (HOSPITAL_COMMUNITY)
Admission: RE | Disposition: A | Discharge status: Home / Self Care | Payer: Self-pay | Source: Ambulatory Visit | Attending: Internal Medicine

## 2014-07-08 DIAGNOSIS — M1990 Unspecified osteoarthritis, unspecified site: Secondary | ICD-10-CM | POA: Insufficient documentation

## 2014-07-08 DIAGNOSIS — Z7982 Long term (current) use of aspirin: Secondary | ICD-10-CM | POA: Insufficient documentation

## 2014-07-08 DIAGNOSIS — J384 Edema of larynx: Secondary | ICD-10-CM | POA: Diagnosis not present

## 2014-07-08 DIAGNOSIS — E785 Hyperlipidemia, unspecified: Secondary | ICD-10-CM | POA: Insufficient documentation

## 2014-07-08 DIAGNOSIS — K219 Gastro-esophageal reflux disease without esophagitis: Secondary | ICD-10-CM | POA: Insufficient documentation

## 2014-07-08 DIAGNOSIS — N189 Chronic kidney disease, unspecified: Secondary | ICD-10-CM | POA: Diagnosis not present

## 2014-07-08 DIAGNOSIS — F17210 Nicotine dependence, cigarettes, uncomplicated: Secondary | ICD-10-CM | POA: Insufficient documentation

## 2014-07-08 DIAGNOSIS — Z79899 Other long term (current) drug therapy: Secondary | ICD-10-CM | POA: Diagnosis not present

## 2014-07-08 DIAGNOSIS — K222 Esophageal obstruction: Secondary | ICD-10-CM | POA: Insufficient documentation

## 2014-07-08 DIAGNOSIS — E119 Type 2 diabetes mellitus without complications: Secondary | ICD-10-CM | POA: Insufficient documentation

## 2014-07-08 DIAGNOSIS — I129 Hypertensive chronic kidney disease with stage 1 through stage 4 chronic kidney disease, or unspecified chronic kidney disease: Secondary | ICD-10-CM | POA: Insufficient documentation

## 2014-07-08 DIAGNOSIS — R1310 Dysphagia, unspecified: Secondary | ICD-10-CM

## 2014-07-08 DIAGNOSIS — K449 Diaphragmatic hernia without obstruction or gangrene: Secondary | ICD-10-CM | POA: Insufficient documentation

## 2014-07-08 HISTORY — PX: ESOPHAGEAL DILATION: SHX303

## 2014-07-08 HISTORY — PX: ESOPHAGOGASTRODUODENOSCOPY: SHX5428

## 2014-07-08 LAB — GLUCOSE, CAPILLARY: GLUCOSE-CAPILLARY: 113 mg/dL — AB (ref 70–99)

## 2014-07-08 SURGERY — EGD (ESOPHAGOGASTRODUODENOSCOPY)
Anesthesia: Moderate Sedation

## 2014-07-08 MED ORDER — SODIUM CHLORIDE 0.9 % IV SOLN
INTRAVENOUS | Status: DC
Start: 2014-07-08 — End: 2014-07-08
  Administered 2014-07-08: 14:00:00 via INTRAVENOUS

## 2014-07-08 MED ORDER — MEPERIDINE HCL 50 MG/ML IJ SOLN
INTRAMUSCULAR | Status: DC | PRN
  Administered 2014-07-08 (×2): 25 mg via INTRAVENOUS

## 2014-07-08 MED ORDER — MIDAZOLAM HCL 5 MG/5ML IJ SOLN
INTRAMUSCULAR | Status: DC
Start: 2014-07-08 — End: 2014-07-08
  Filled 2014-07-08: qty 10

## 2014-07-08 MED ORDER — ATROPINE SULFATE 0.1 MG/ML IJ SOLN
INTRAMUSCULAR | Status: DC | PRN
  Administered 2014-07-08: 0.5 mg via INTRAVENOUS

## 2014-07-08 MED ORDER — MEPERIDINE HCL 50 MG/ML IJ SOLN
INTRAMUSCULAR | Status: AC
  Filled 2014-07-08: qty 1

## 2014-07-08 MED ORDER — MIDAZOLAM HCL 5 MG/5ML IJ SOLN
INTRAMUSCULAR | Status: DC | PRN
  Administered 2014-07-08 (×6): 2 mg via INTRAVENOUS

## 2014-07-08 MED ORDER — MIDAZOLAM HCL 5 MG/5ML IJ SOLN
INTRAMUSCULAR | Status: AC
  Filled 2014-07-08: qty 5

## 2014-07-08 MED ORDER — STERILE WATER FOR IRRIGATION IR SOLN
Status: DC | PRN
  Administered 2014-07-08: 15:00:00

## 2014-07-08 MED ORDER — BUTAMBEN-TETRACAINE-BENZOCAINE 2-2-14 % EX AERO
INHALATION_SPRAY | CUTANEOUS | Status: DC | PRN
  Administered 2014-07-08: 2 via TOPICAL

## 2014-07-08 NOTE — Op Note (Signed)
EGD PROCEDURE REPORT  PATIENT:  Reginald Stephens  MR#:  [id number] Birthdate:  Mar 12, 1950, 64 y.o., male Endoscopist:  Dr. Rogene Houston, MD Referred By:  Dr. Burley Saver, MD Procedure Date: 07/08/2014  Procedure:   EGD with ED  Indications:  Patient is 64 year old African-American male was chronic GERD maintained on PPI presents with few months history of dysphagia primarily to solids. He points to suprasternal area soft bolus obstruction. He was recently evaluated by Dr. Benjamine Mola and noted tomild posterior laryngeal edema secondary to GERD.           Informed Consent:  The risks, benefits, alternatives & imponderables which include, but are not limited to, bleeding, infection, perforation, drug reaction and potential missed lesion have been reviewed.  The potential for biopsy, lesion removal, esophageal dilation, etc. have also been discussed.  Questions have been answered.  All parties agreeable.  Please see history & physical in medical record for more information.  Medications:  Demerol 50 mg IV Versed 12 mg IV Atropine 0.5 mg IV for hypersalivation. Cetacaine spray topically for oropharyngeal anesthesia  Description of procedure:  The endoscope was introduced through the mouth and advanced to the second portion of the duodenum without difficulty or limitations. The mucosal surfaces were surveyed very carefully during advancement of the scope and upon withdrawal.  Findings:  Esophagus:  Mucosa of the esophagus was normal. Incomplete ring noted at GE junction along with another one proximal to it. GEJ:  40 cm Hiatus:  43 cm Stomach:  Stomach was empty and distended very well with insufflation. Folds in the proximal stomach were normal. Examination of mucosa at gastric body, antrum, pyloric channel, angularis fundus and cardia was normal. Duodenum:  Normal bulbar and post bulbar mucosa.  Therapeutic/Diagnostic Maneuvers Performed:   Esophagus was dilated by passing 54 [place] Maloney dilator  to full insertion. As the dilators withdrawn endoscope was passed again and no mucosal disruption noted to esophagus. Esophagus was then dilated by passing 56 French Maloney dilator to full insertion and once again no disruption noted to esophageal mucosa.  Complications:  none  Impression: Non-critical ring noted proximal to GE junction along with incomplete Schatzki's ring. Small sliding hiatal hernia. Normal examination of stomach versus second part of the duodenum. Esophagus dilated by passing 54 and 56 French Maloney dilators but no mucosal disruption induced.  Comment; Since patient does not have high-grade rings I wonder if he has esophageal motility disorder or may be he just does not chew his food.  Recommendations:  Anti-reflux measures reinforced. Patient will call with progress report next week. If he remains with dysphagia will consider barium pill esophagogram.  Eivin Mascio U  07/08/2014  3:29 PM  CC: Dr. Collene Mares, Marland Kitchen, PA-C & Dr. No ref. provider found  CC: Dr. Burley Saver, MD

## 2014-07-08 NOTE — Discharge Instructions (Signed)
Resume usual medications and diet. Remember to chew food thoroughly before swallowing. No driving for 24 hours. Please call office with progress report in one week.   Esophagogastroduodenoscopy Care After Refer to this sheet in the next few weeks. These instructions provide you with information on caring for yourself after your procedure. Your caregiver may also give you more specific instructions. Your treatment has been planned according to current medical practices, but problems sometimes occur. Call your caregiver if you have any problems or questions after your procedure.  HOME CARE INSTRUCTIONS  Do not eat or drink anything until the numbing medicine (local anesthetic) has worn off and your gag reflex has returned. You will know that the local anesthetic has worn off when you can swallow comfortably.  Do not drive for 12 hours after the procedure or as directed by your caregiver.  Only take medicines as directed by your caregiver. SEEK MEDICAL CARE IF:   You cannot stop coughing.  You are not urinating at all or less than usual. SEEK IMMEDIATE MEDICAL CARE IF:  You have difficulty swallowing.  You cannot eat or drink.  You have worsening throat or chest pain.  You have dizziness, lightheadedness, or you faint.  You have nausea or vomiting.  You have chills.  You have a fever.  You have severe abdominal pain.  You have black, tarry, or bloody stools. Document Released: 02/06/2012 Document Reviewed: 02/06/2012 [facility] Patient Information 2015 [place]. This information is not intended to replace advice given to you by your health care provider. Make sure you discuss any questions you have with your health care provider.

## 2014-07-08 NOTE — H&P (Signed)
Reginald Stephens is an 64 y.o. male.   Chief Complaint: Patient is here for EGD and ED. HPI: patient is 64 year old Caucasian male was chronic GERD and now presents with few months history of dysphagia to solids and occasionally to liquids. He points to suprasternal area site of bolus obstruction. He had EGD 7 years ago and he's not sure if he had his esophagus dilated. He was recent seen by Dr. Benjamine Mola and noted to have mild posterior pharyngeal edema consistent with gastroesophageal reflux disease. Patient states he does not have heartburn as long as he watches his diet. Patient denies nausea vomiting anorexia weight loss or melena  Past Medical History  Diagnosis Date  . Diabetes mellitus   . Hypertension   . Arthritis   . Chronic kidney disease   . Hyperlipidemia     Past Surgical History  Procedure Laterality Date  . Tonsillectomy      age 46  . Cystoscopy  2011  . Tonsillectomy    . Colonoscopy  02/15/2011    Procedure: COLONOSCOPY;  Surgeon: Rogene Houston, MD;  [facility]: [facility];  Service: Endoscopy;  Laterality: N/A;    Family History  Problem Relation Age of Onset  . Heart attack Maternal Grandmother   . Stroke Maternal Grandfather    Social History:  reports that he has been smoking Cigarettes.  He has a 25 pack-year smoking history. He has never used smokeless tobacco. He reports that he drinks about 7.2 oz of alcohol per week. He reports that he does not use illicit drugs.  Allergies:  Allergies  Allergen Reactions  . Shellfish Allergy     Medications Prior to Admission  Medication Sig Dispense Refill  . aspirin EC 81 MG tablet Take 81 mg by mouth daily.      Marland Kitchen atorvastatin (LIPITOR) 20 MG tablet Take 20 mg by mouth every morning.      . diltiazem (CARTIA XT) 300 MG 24 hr capsule Take 300 mg by mouth daily.      . diphenhydrAMINE (BENADRYL DYE-FREE ALLERGY) 25 mg capsule Take 25 mg by mouth at bedtime.      Marland Kitchen glipiZIDE (GLUCOTROL XL) 2.5 MG 24 hr tablet Take  2.5 mg by mouth daily.      Marland Kitchen ibuprofen (ADVIL,MOTRIN) 400 MG tablet Take 1 tablet (400 mg total) by mouth every 6 (six) hours as needed. Limit usage to 3-5 days. 30 tablet 0  . lisinopril-hydrochlorothiazide (PRINZIDE,ZESTORETIC) 20-25 MG per tablet Take 1 tablet by mouth daily.     . metFORMIN (GLUCOPHAGE) 1000 MG tablet Take 1,000 mg by mouth 2 (two) times daily with a meal.      . Omeprazole Magnesium (PRILOSEC OTC PO) Take 40 mg by mouth 2 (two) times daily.       Results for orders placed or performed during the hospital encounter of 07/08/14 (from the past 48 hour(s))  Glucose, capillary     Status: Abnormal   Collection Time: 07/08/14  2:15 PM  Result Value Ref Range   Glucose-Capillary 113 (H) 70 - 99 mg/dL   No results found.  ROS  Blood pressure 123/70, pulse 64, temperature 97.5 F (36.4 C), temperature source Oral, resp. rate 19, SpO2 100 %. Physical Exam  Constitutional: He appears well-developed and well-nourished.  HENT:  Mouth/Throat: Oropharynx is clear and moist.  Eyes: Conjunctivae are normal. No scleral icterus.  Neck: No thyromegaly present.  Cardiovascular: Normal rate, regular rhythm and normal heart sounds.   No murmur  heard. Respiratory: Effort normal and breath sounds normal.  GI: Soft. He exhibits no distension and no mass. There is no tenderness.  Musculoskeletal: He exhibits no edema.  Lymphadenopathy:    He has no cervical adenopathy.  Neurological: He is alert.  Skin: Skin is warm and dry.     Assessment/Plan Solid food dysphagia in patient with history of GERD. EGD with ED  Shemiah Rosch U 07/08/2014, 2:49 PM

## 2014-07-09 ENCOUNTER — Encounter (HOSPITAL_COMMUNITY): Payer: Self-pay | Admitting: Internal Medicine

## 2015-01-20 ENCOUNTER — Other Ambulatory Visit (HOSPITAL_COMMUNITY): Payer: Self-pay | Admitting: Physician Assistant

## 2015-01-20 ENCOUNTER — Ambulatory Visit (HOSPITAL_COMMUNITY)
Admission: RE | Admit: 2015-01-20 | Discharge: 2015-01-20 | Disposition: A | Discharge status: Home / Self Care | Payer: BLUE CROSS/BLUE SHIELD | Source: Ambulatory Visit | Attending: Physician Assistant | Admitting: Physician Assistant

## 2015-01-20 DIAGNOSIS — M1288 Other specific arthropathies, not elsewhere classified, other specified site: Secondary | ICD-10-CM | POA: Insufficient documentation

## 2015-01-20 DIAGNOSIS — M5136 Other intervertebral disc degeneration, lumbar region: Secondary | ICD-10-CM | POA: Insufficient documentation

## 2015-01-20 DIAGNOSIS — M542 Cervicalgia: Secondary | ICD-10-CM | POA: Diagnosis not present

## 2015-01-20 DIAGNOSIS — M5440 Lumbago with sciatica, unspecified side: Secondary | ICD-10-CM

## 2015-01-20 DIAGNOSIS — M4802 Spinal stenosis, cervical region: Secondary | ICD-10-CM | POA: Insufficient documentation

## 2015-01-20 DIAGNOSIS — M545 Low back pain: Secondary | ICD-10-CM | POA: Diagnosis present

## 2015-02-16 ENCOUNTER — Telehealth: Payer: Self-pay | Admitting: Cardiovascular Disease

## 2015-02-16 NOTE — Telephone Encounter (Signed)
Received records from [facility] for appointment with Dr Gwenlyn Found on 03/01/15.  Records given to [facility] (medical records) for Dr Kennon Holter schedule on 03/01/15. lp

## 2015-03-01 ENCOUNTER — Encounter: Payer: Self-pay | Admitting: Cardiovascular Disease

## 2015-03-01 ENCOUNTER — Encounter (INDEPENDENT_AMBULATORY_CARE_PROVIDER_SITE_OTHER): Payer: BLUE CROSS/BLUE SHIELD

## 2015-03-01 ENCOUNTER — Ambulatory Visit (INDEPENDENT_AMBULATORY_CARE_PROVIDER_SITE_OTHER): Payer: BLUE CROSS/BLUE SHIELD | Admitting: Cardiovascular Disease

## 2015-03-01 VITALS — BP 144/74 | HR 95 | Ht 70.0 in | Wt 231.0 lb

## 2015-03-01 DIAGNOSIS — I10 Essential (primary) hypertension: Secondary | ICD-10-CM | POA: Diagnosis not present

## 2015-03-01 DIAGNOSIS — R002 Palpitations: Secondary | ICD-10-CM

## 2015-03-01 NOTE — Patient Instructions (Signed)
Medication Instructions:  Your physician recommends that you continue on your current medications as directed. Please refer to the Current Medication list given to you today.   Labwork: none  Testing/Procedures: Your physician has recommended that you wear an event monitor. Event monitors are medical devices that record the heart's electrical activity. Doctors most often [place] these monitors to diagnose arrhythmias. Arrhythmias are problems with the speed or rhythm of the heartbeat. The monitor is a small, portable device. You can wear one while you do your normal daily activities. This is usually used to diagnose what is causing palpitations/syncope (passing out). 2 WEEKS   Follow-Up: Follow up with Dr. Gwenlyn Found as needed - Pending results of monitor   Any Other Special Instructions Will Be Listed Below (If Applicable).     If you need a refill on your cardiac medications before your next appointment, please call your pharmacy.

## 2015-03-01 NOTE — Assessment & Plan Note (Signed)
History of hyperlipidemia or atorvastatin followed by his PCP

## 2015-03-01 NOTE — Assessment & Plan Note (Signed)
History of continued tobacco abuse of one pack per day recalcitrant to risk factor modification.. 

## 2015-03-01 NOTE — Assessment & Plan Note (Signed)
Reginald Stephens has complained of a "irregular heartbeat" occurring on a daily basis recently. He denies increasing shortness of breath or presyncope. His EKG today shows sinus rhythm without arrhythmia. I'm going to obtain a 2 week event monitor to further evaluate this.

## 2015-03-01 NOTE — Progress Notes (Signed)
03/01/2015 Grace Bushy   November 15, 1950  [id number]  Primary Physician Collene Mares, PA-C Primary Cardiologist: Lorretta Harp MD Renae Gloss   HPI:  Reginald Stephens is a 64 year old moderately overweight married African American male father of 2, grandfather to 4 grandchildren referred by [facility] [place] in [place] for evaluation of an auscultated Murmur. His cardiac risk factors are notable for diabetes, hypertension and hyperlipidemia. He does smoke one pack per day and has a 45-pack-year history of tobacco abuse. There is no family history of heart disease. He's never had a heart attack or stroke and denies chest pain or shortness of breath. He may have reactive airways disease. He saw a Conservation officer, historic buildings at [place] who apparently auscultated a murmur referred him here for further evaluation. Since I saw him to a half years ago. Denies chest pain. Does have shortness of breath which he attributes to reactive airways disease. He also complains of palpitations which are new for him.   Current Outpatient Prescriptions  Medication Sig Dispense Refill  . aspirin EC 81 MG tablet Take 81 mg by mouth daily.      Marland Kitchen atorvastatin (LIPITOR) 20 MG tablet Take 20 mg by mouth every morning.      . diltiazem (CARTIA XT) 300 MG 24 hr capsule Take 300 mg by mouth daily.      . diphenhydrAMINE (BENADRYL DYE-FREE ALLERGY) 25 mg capsule Take 25 mg by mouth at bedtime.      Marland Kitchen glipiZIDE (GLUCOTROL XL) 2.5 MG 24 hr tablet Take 2.5 mg by mouth daily.      Marland Kitchen ibuprofen (ADVIL,MOTRIN) 400 MG tablet Take 1 tablet (400 mg total) by mouth every 6 (six) hours as needed. Limit usage to 3-5 days. 30 tablet 0  . lisinopril-hydrochlorothiazide (PRINZIDE,ZESTORETIC) 20-25 MG per tablet Take 1 tablet by mouth daily.     . metFORMIN (GLUCOPHAGE) 1000 MG tablet Take 1,000 mg by mouth 2 (two) times daily with a meal.      . Omeprazole Magnesium (PRILOSEC OTC PO) Take 40 mg by mouth 2  (two) times daily.      No current facility-administered medications for this visit.    Allergies  Allergen Reactions  . Shellfish Allergy     Social History   Social History  . Marital Status: Married    Spouse Name: N/A  . Number of Children: N/A  . Years of Education: N/A   Occupational History  . Not on file.   Social History Main Topics  . Smoking status: Current Every Day Smoker -- 1.00 packs/day for 25 years    Types: Cigarettes  . Smokeless tobacco: Never Used  . Alcohol Use: 7.2 oz/week    12 Cans of beer per week  . Drug Use: No  . Sexual Activity: Not on file   Other Topics Concern  . Not on file   Social History Narrative     Review of Systems: General: negative for chills, fever, night sweats or weight changes.  Cardiovascular: negative for chest pain, dyspnea on exertion, edema, orthopnea, palpitations, paroxysmal nocturnal dyspnea or shortness of breath Dermatological: negative for rash Respiratory: negative for cough or wheezing Urologic: negative for hematuria Abdominal: negative for nausea, vomiting, diarrhea, bright red blood per rectum, melena, or hematemesis Neurologic: negative for visual changes, syncope, or dizziness All other systems reviewed and are otherwise negative except as noted above.    Blood pressure 144/74, pulse 95, height 5\' 10"  (1.778 m), weight  231 lb (104.781 kg).  General appearance: alert and no distress Neck: no adenopathy, no carotid bruit, no JVD, supple, symmetrical, trachea midline and thyroid not enlarged, symmetric, no tenderness/mass/nodules Lungs: clear to auscultation bilaterally Heart: regular rate and rhythm, S1, S2 normal, no murmur, click, rub or gallop Extremities: extremities normal, atraumatic, no cyanosis or edema  EKG normal sinus rhythm at 95 without ST or T-wave changes. I personally reviewed this EKG  ASSESSMENT AND PLAN:   Tobacco abuse History of continued tobacco abuse of one pack per day  recalcitrant to risk factor modification  Hyperlipidemia History of hyperlipidemia or atorvastatin followed by his PCP  Essential hypertension History of hypertension with blood pressure measured at 144/74. He is on diltiazem, lisinopril and hydrochlorothiazide. Continue current meds at current dose  Palpitations Mr. Reginald Stephens has complained of a "irregular heartbeat" occurring on a daily basis recently. He denies increasing shortness of breath or presyncope. His EKG today shows sinus rhythm without arrhythmia. I'm going to obtain a 2 week event monitor to further evaluate this.      Lorretta Harp MD FACP,FACC,FAHA, [facility] 03/01/2015 11:49 AM

## 2015-03-01 NOTE — Assessment & Plan Note (Signed)
History of hypertension with blood pressure measured at 144/74. He is on diltiazem, lisinopril and hydrochlorothiazide. Continue current meds at current dose

## 2015-06-28 DIAGNOSIS — E6609 Other obesity due to excess calories: Secondary | ICD-10-CM | POA: Diagnosis not present

## 2015-06-28 DIAGNOSIS — I10 Essential (primary) hypertension: Secondary | ICD-10-CM | POA: Diagnosis not present

## 2015-06-28 DIAGNOSIS — E119 Type 2 diabetes mellitus without complications: Secondary | ICD-10-CM | POA: Diagnosis not present

## 2015-06-28 DIAGNOSIS — J019 Acute sinusitis, unspecified: Secondary | ICD-10-CM | POA: Diagnosis not present

## 2015-06-28 DIAGNOSIS — Z1389 Encounter for screening for other disorder: Secondary | ICD-10-CM | POA: Diagnosis not present

## 2015-06-28 DIAGNOSIS — Z6833 Body mass index (BMI) 33.0-33.9, adult: Secondary | ICD-10-CM | POA: Diagnosis not present

## 2015-07-26 DIAGNOSIS — R945 Abnormal results of liver function studies: Secondary | ICD-10-CM | POA: Diagnosis not present

## 2015-10-28 DIAGNOSIS — R109 Unspecified abdominal pain: Secondary | ICD-10-CM | POA: Diagnosis not present

## 2015-10-28 DIAGNOSIS — E782 Mixed hyperlipidemia: Secondary | ICD-10-CM | POA: Diagnosis not present

## 2015-10-28 DIAGNOSIS — Z6831 Body mass index (BMI) 31.0-31.9, adult: Secondary | ICD-10-CM | POA: Diagnosis not present

## 2015-10-28 DIAGNOSIS — Z1389 Encounter for screening for other disorder: Secondary | ICD-10-CM | POA: Diagnosis not present

## 2015-10-28 DIAGNOSIS — E119 Type 2 diabetes mellitus without complications: Secondary | ICD-10-CM | POA: Diagnosis not present

## 2015-10-28 DIAGNOSIS — R195 Other fecal abnormalities: Secondary | ICD-10-CM | POA: Diagnosis not present

## 2015-10-28 DIAGNOSIS — I10 Essential (primary) hypertension: Secondary | ICD-10-CM | POA: Diagnosis not present

## 2015-12-26 ENCOUNTER — Ambulatory Visit (INDEPENDENT_AMBULATORY_CARE_PROVIDER_SITE_OTHER): Payer: BLUE CROSS/BLUE SHIELD | Admitting: Otolaryngology

## 2015-12-26 DIAGNOSIS — H903 Sensorineural hearing loss, bilateral: Secondary | ICD-10-CM | POA: Diagnosis not present

## 2015-12-26 DIAGNOSIS — H6983 Other specified disorders of Eustachian tube, bilateral: Secondary | ICD-10-CM | POA: Diagnosis not present

## 2016-02-17 DIAGNOSIS — E6609 Other obesity due to excess calories: Secondary | ICD-10-CM | POA: Diagnosis not present

## 2016-02-17 DIAGNOSIS — E119 Type 2 diabetes mellitus without complications: Secondary | ICD-10-CM | POA: Diagnosis not present

## 2016-02-17 DIAGNOSIS — Z6832 Body mass index (BMI) 32.0-32.9, adult: Secondary | ICD-10-CM | POA: Diagnosis not present

## 2016-02-17 DIAGNOSIS — Z1389 Encounter for screening for other disorder: Secondary | ICD-10-CM | POA: Diagnosis not present

## 2016-02-24 DIAGNOSIS — Z6832 Body mass index (BMI) 32.0-32.9, adult: Secondary | ICD-10-CM | POA: Diagnosis not present

## 2016-02-24 DIAGNOSIS — Z1389 Encounter for screening for other disorder: Secondary | ICD-10-CM | POA: Diagnosis not present

## 2016-02-24 DIAGNOSIS — E6609 Other obesity due to excess calories: Secondary | ICD-10-CM | POA: Diagnosis not present

## 2016-03-29 ENCOUNTER — Ambulatory Visit (INDEPENDENT_AMBULATORY_CARE_PROVIDER_SITE_OTHER): Payer: BLUE CROSS/BLUE SHIELD | Admitting: Otolaryngology

## 2016-03-29 DIAGNOSIS — H6983 Other specified disorders of Eustachian tube, bilateral: Secondary | ICD-10-CM | POA: Diagnosis not present

## 2016-03-29 DIAGNOSIS — H9313 Tinnitus, bilateral: Secondary | ICD-10-CM

## 2016-03-29 DIAGNOSIS — H903 Sensorineural hearing loss, bilateral: Secondary | ICD-10-CM | POA: Diagnosis not present

## 2016-04-23 ENCOUNTER — Ambulatory Visit (INDEPENDENT_AMBULATORY_CARE_PROVIDER_SITE_OTHER): Payer: BLUE CROSS/BLUE SHIELD | Admitting: Internal Medicine

## 2016-04-23 ENCOUNTER — Encounter (INDEPENDENT_AMBULATORY_CARE_PROVIDER_SITE_OTHER): Payer: Self-pay | Admitting: Internal Medicine

## 2016-04-23 VITALS — BP 140/70 | HR 72 | Temp 98.7°F | Ht 70.0 in | Wt 227.7 lb

## 2016-04-23 DIAGNOSIS — K219 Gastro-esophageal reflux disease without esophagitis: Secondary | ICD-10-CM | POA: Diagnosis not present

## 2016-04-23 MED ORDER — DEXLANSOPRAZOLE 60 MG PO CPDR: 60.0000 mg | DELAYED_RELEASE_CAPSULE | Freq: Every day | ORAL | 3 refills | Status: DC

## 2016-04-23 NOTE — Patient Instructions (Signed)
Samples of Dexilant given to patient.  PR in 2 weeks. Get Bone density at your PCP

## 2016-04-23 NOTE — Progress Notes (Signed)
   Subjective:    Patient ID: Reginald Stephens, male    DOB: 1950/03/18, 66 y.o.   MRN: [id number]  HPI  Here today for f/u. He was last seen in 2016.  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 Small sliding hiatal hernia. Normal examination of stomach versus second part of the duodenum. Esophagus dilated by passing 54 and 56 French Maloney dilators but no mucosal disruption induced. Presents today for f/u. He tells me he saw Dr. Benjamine Mola for his ears.  He takes Omeprazole 40mg  BID. He is concerned about the side effects.  His acid reflux is controlled with BID dosing. His appetite is good. He has a bad habit of getting up and eating at night and then going back to bed.  Appetite is good. No weight loss. Usually has a BM daily.     Review of Systems Past Medical History:  Diagnosis Date  . Arthritis   . Chronic kidney disease   . Diabetes mellitus   . Hyperlipidemia   . Hypertension   . Palpitations     Past Surgical History:  Procedure Laterality Date  . COLONOSCOPY  02/15/2011   Procedure: COLONOSCOPY;  Surgeon: Rogene Houston, MD;  [facility]: [facility];  Service: Endoscopy;  Laterality: N/A;  . CYSTOSCOPY  2011  . ESOPHAGEAL DILATION N/A 07/08/2014   Procedure: ESOPHAGEAL DILATION;  Surgeon: Rogene Houston, MD;  [facility];  Service: Endoscopy;  Laterality: N/A;  . ESOPHAGOGASTRODUODENOSCOPY N/A 07/08/2014   Procedure: ESOPHAGOGASTRODUODENOSCOPY (EGD);  Surgeon: Rogene Houston, MD;  [facility]: [facility];  Service: Endoscopy;  Laterality: N/A;  245  . TONSILLECTOMY     age 26  . TONSILLECTOMY      Allergies  Allergen Reactions  . Shellfish Allergy     Current Outpatient Prescriptions on File Prior to Visit  Medication Sig Dispense Refill  . aspirin EC 81 MG tablet Take 81 mg by mouth daily.      Marland Kitchen atorvastatin (LIPITOR) 20 MG tablet Take 20 mg  by mouth every morning.      . diltiazem (CARTIA XT) 300 MG 24 hr capsule Take 300 mg by mouth daily.      Marland Kitchen lisinopril-hydrochlorothiazide (PRINZIDE,ZESTORETIC) 20-25 MG per tablet Take 1 tablet by mouth daily.     . metFORMIN (GLUCOPHAGE) 1000 MG tablet Take 1,000 mg by mouth 2 (two) times daily with a meal.      . Omeprazole Magnesium (PRILOSEC OTC PO) Take 40 mg by mouth 2 (two) times daily.      No current facility-administered medications on file prior to visit.        Objective:   Physical Exam Blood pressure 140/70, pulse 72, temperature 98.7 F (37.1 C), height 5\' 10"  (1.778 m), weight 227 lb 11.2 oz (103.3 kg).  Alert and oriented. Skin warm and dry. Oral mucosa is moist.   . Sclera anicteric, conjunctivae is pink. Thyroid not enlarged. No cervical lymphadenopathy. Lungs clear. Heart regular rate and rhythm.  Abdomen is soft. Bowel sounds are positive. No hepatomegaly. No abdominal masses felt. No tenderness.  No edema to lower extremities.  .       Assessment & Plan:  GERD. Am going to try him on Dexilant and see how he does. Rx sent to his pharmacy He will call me and let me know.

## 2016-06-05 ENCOUNTER — Ambulatory Visit (INDEPENDENT_AMBULATORY_CARE_PROVIDER_SITE_OTHER): Payer: BLUE CROSS/BLUE SHIELD | Admitting: Urology

## 2016-06-05 DIAGNOSIS — R310 Gross hematuria: Secondary | ICD-10-CM | POA: Diagnosis not present

## 2016-06-05 DIAGNOSIS — N200 Calculus of kidney: Secondary | ICD-10-CM

## 2016-06-06 ENCOUNTER — Other Ambulatory Visit: Payer: Self-pay | Admitting: Urology

## 2016-06-06 DIAGNOSIS — R310 Gross hematuria: Secondary | ICD-10-CM

## 2016-06-08 DIAGNOSIS — I10 Essential (primary) hypertension: Secondary | ICD-10-CM | POA: Diagnosis not present

## 2016-06-08 DIAGNOSIS — E119 Type 2 diabetes mellitus without complications: Secondary | ICD-10-CM | POA: Diagnosis not present

## 2016-06-08 DIAGNOSIS — Z1389 Encounter for screening for other disorder: Secondary | ICD-10-CM | POA: Diagnosis not present

## 2016-06-08 DIAGNOSIS — E782 Mixed hyperlipidemia: Secondary | ICD-10-CM | POA: Diagnosis not present

## 2016-06-08 DIAGNOSIS — E669 Obesity, unspecified: Secondary | ICD-10-CM | POA: Diagnosis not present

## 2016-06-08 DIAGNOSIS — Z6832 Body mass index (BMI) 32.0-32.9, adult: Secondary | ICD-10-CM | POA: Diagnosis not present

## 2016-06-14 ENCOUNTER — Encounter (HOSPITAL_COMMUNITY): Payer: Self-pay

## 2016-06-14 ENCOUNTER — Ambulatory Visit (HOSPITAL_COMMUNITY)
Admission: RE | Admit: 2016-06-14 | Discharge: 2016-06-14 | Disposition: A | Discharge status: Home / Self Care | Payer: BLUE CROSS/BLUE SHIELD | Source: Ambulatory Visit | Attending: Urology | Admitting: Urology

## 2016-06-14 DIAGNOSIS — N200 Calculus of kidney: Secondary | ICD-10-CM | POA: Diagnosis not present

## 2016-06-14 DIAGNOSIS — D3502 Benign neoplasm of left adrenal gland: Secondary | ICD-10-CM | POA: Insufficient documentation

## 2016-06-14 DIAGNOSIS — R310 Gross hematuria: Secondary | ICD-10-CM

## 2016-06-14 DIAGNOSIS — I700 Atherosclerosis of aorta: Secondary | ICD-10-CM | POA: Insufficient documentation

## 2016-06-14 HISTORY — DX: Unspecified asthma, uncomplicated: J45.909

## 2016-06-14 LAB — POCT I-STAT CREATININE: Creatinine, Ser: 1 mg/dL (ref 0.61–1.24)

## 2016-06-14 MED ORDER — IOPAMIDOL (ISOVUE-300) INJECTION 61%
125.0000 mL | Freq: Once | INTRAVENOUS | Status: AC | PRN
  Administered 2016-06-14: 125 mL via INTRAVENOUS

## 2016-06-19 ENCOUNTER — Ambulatory Visit (INDEPENDENT_AMBULATORY_CARE_PROVIDER_SITE_OTHER): Payer: BLUE CROSS/BLUE SHIELD | Admitting: Urology

## 2016-06-19 DIAGNOSIS — N210 Calculus in bladder: Secondary | ICD-10-CM

## 2016-06-19 DIAGNOSIS — R310 Gross hematuria: Secondary | ICD-10-CM

## 2016-07-17 DIAGNOSIS — E119 Type 2 diabetes mellitus without complications: Secondary | ICD-10-CM | POA: Diagnosis not present

## 2016-07-17 DIAGNOSIS — Z6831 Body mass index (BMI) 31.0-31.9, adult: Secondary | ICD-10-CM | POA: Diagnosis not present

## 2016-07-17 DIAGNOSIS — R531 Weakness: Secondary | ICD-10-CM | POA: Diagnosis not present

## 2016-07-17 DIAGNOSIS — I10 Essential (primary) hypertension: Secondary | ICD-10-CM | POA: Diagnosis not present

## 2016-07-17 DIAGNOSIS — R52 Pain, unspecified: Secondary | ICD-10-CM | POA: Diagnosis not present

## 2016-07-17 DIAGNOSIS — R630 Anorexia: Secondary | ICD-10-CM | POA: Diagnosis not present

## 2016-07-17 DIAGNOSIS — R5383 Other fatigue: Secondary | ICD-10-CM | POA: Diagnosis not present

## 2016-07-17 DIAGNOSIS — B349 Viral infection, unspecified: Secondary | ICD-10-CM | POA: Diagnosis not present

## 2016-07-17 DIAGNOSIS — E782 Mixed hyperlipidemia: Secondary | ICD-10-CM | POA: Diagnosis not present

## 2016-09-16 DIAGNOSIS — N200 Calculus of kidney: Secondary | ICD-10-CM | POA: Diagnosis not present

## 2016-12-22 ENCOUNTER — Encounter (INDEPENDENT_AMBULATORY_CARE_PROVIDER_SITE_OTHER): Payer: Self-pay

## 2016-12-22 ENCOUNTER — Ambulatory Visit (HOSPITAL_COMMUNITY)
Admission: RE | Admit: 2016-12-22 | Discharge: 2016-12-22 | Disposition: A | Discharge status: Home / Self Care | Payer: BLUE CROSS/BLUE SHIELD | Source: Ambulatory Visit | Attending: Urology | Admitting: Urology

## 2016-12-22 ENCOUNTER — Other Ambulatory Visit: Payer: Self-pay | Admitting: Urology

## 2016-12-22 DIAGNOSIS — N200 Calculus of kidney: Secondary | ICD-10-CM | POA: Insufficient documentation

## 2016-12-26 ENCOUNTER — Ambulatory Visit (INDEPENDENT_AMBULATORY_CARE_PROVIDER_SITE_OTHER): Payer: BLUE CROSS/BLUE SHIELD | Admitting: Urology

## 2016-12-26 DIAGNOSIS — N200 Calculus of kidney: Secondary | ICD-10-CM

## 2016-12-26 DIAGNOSIS — R310 Gross hematuria: Secondary | ICD-10-CM | POA: Diagnosis not present

## 2017-01-03 ENCOUNTER — Encounter (INDEPENDENT_AMBULATORY_CARE_PROVIDER_SITE_OTHER): Payer: Self-pay | Admitting: Internal Medicine

## 2017-01-07 ENCOUNTER — Encounter (INDEPENDENT_AMBULATORY_CARE_PROVIDER_SITE_OTHER): Payer: Self-pay | Admitting: Internal Medicine

## 2017-01-07 ENCOUNTER — Other Ambulatory Visit (INDEPENDENT_AMBULATORY_CARE_PROVIDER_SITE_OTHER): Payer: Self-pay | Admitting: *Deleted

## 2017-01-07 ENCOUNTER — Other Ambulatory Visit (INDEPENDENT_AMBULATORY_CARE_PROVIDER_SITE_OTHER): Payer: Self-pay | Admitting: Internal Medicine

## 2017-01-07 ENCOUNTER — Ambulatory Visit (INDEPENDENT_AMBULATORY_CARE_PROVIDER_SITE_OTHER): Payer: BLUE CROSS/BLUE SHIELD | Admitting: Internal Medicine

## 2017-01-07 VITALS — BP 130/70 | HR 64 | Temp 97.5°F | Ht 70.0 in | Wt 219.6 lb

## 2017-01-07 DIAGNOSIS — R17 Unspecified jaundice: Secondary | ICD-10-CM

## 2017-01-07 LAB — COMPLETE METABOLIC PANEL WITH GFR
AG RATIO: 1.5 (calc) (ref 1.0–2.5)
ALBUMIN MSPROF: 4 g/dL (ref 3.6–5.1)
ALT: 9 U/L (ref 9–46)
AST: 12 U/L (ref 10–35)
Alkaline phosphatase (APISO): 65 U/L (ref 40–115)
BUN: 14 mg/dL (ref 7–25)
CO2: 30 mmol/L (ref 20–32)
CREATININE: 0.87 mg/dL (ref 0.70–1.25)
Calcium: 9.7 mg/dL (ref 8.6–10.3)
Chloride: 102 mmol/L (ref 98–110)
GFR, EST AFRICAN AMERICAN: 106 mL/min/{1.73_m2} (ref >=60)
GFR, Est Non African American: 91 mL/min/{1.73_m2} (ref >=60)
GLOBULIN: 2.6 g/dL (ref 1.9–3.7)
Glucose, Bld: 113 mg/dL — ABNORMAL HIGH (ref 65–99)
POTASSIUM: 4 mmol/L (ref 3.5–5.3)
SODIUM: 139 mmol/L (ref 135–146)
TOTAL PROTEIN: 6.6 g/dL (ref 6.1–8.1)
Total Bilirubin: 0.5 mg/dL (ref 0.2–1.2)

## 2017-01-07 NOTE — Progress Notes (Signed)
Referred by Dr. Nicolette Bang for increased bilirubin in urine. [facility]. Seen by Dr. Alyson Ingles 12/26/2016 for blood in urine. Hx of kideny stone.  Urinalysis revealed  Urine Yellow. Appearance clear  PH 8.0 Bilirubin negative, Ketones negative Protein Negative. Urobilinogen 4.0 Nitrates negative.  He denies really being sick. No nausea or vomiting.  Appetite has been good.Has lost about 8 pounds since his visit in February.    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 Small sliding hiatal hernia. Normal examination of stomach versus second part of the duodenum. Esophagus dilated by passing 54 and 56 French Maloney dilators but no mucosal disruption induced.  Past Medical History:  Diagnosis Date  . Arthritis   . Asthma   . Chronic kidney disease   . Diabetes mellitus   . GERD (gastroesophageal reflux disease)   . Hyperlipidemia   . Hypertension   . Palpitations     Past Surgical History:  Procedure Laterality Date  . CYSTOSCOPY  2011  . TONSILLECTOMY     age 37  . TONSILLECTOMY      Allergies  Allergen Reactions  . Shellfish Allergy     Current Outpatient Medications on File Prior to Visit  Medication Sig Dispense Refill  . aspirin EC 81 MG tablet Take 81 mg by mouth daily.      Marland Kitchen atorvastatin (LIPITOR) 20 MG tablet Take 20 mg by mouth every morning.      Marland Kitchen dexlansoprazole (DEXILANT) 60 MG capsule Take 1 capsule (60 mg total) by mouth daily. 90 capsule 3  . diltiazem (CARTIA XT) 300 MG 24 hr capsule Take 300 mg by mouth daily.      Marland Kitchen lisinopril-hydrochlorothiazide (PRINZIDE,ZESTORETIC) 20-25 MG per tablet Take 1 tablet by mouth daily.     . metFORMIN (GLUCOPHAGE) 1000 MG tablet Take 1,000 mg by mouth 2 (two) times daily with a meal.       No current facility-administered medications on file prior to visit.    Physical; Alert and oriented. Skin warm and dry. Oral  mucosa is moist.   . Sclera anicteric, conjunctivae is pink. Thyroid not enlarged. No cervical lymphadenopathy. Lungs clear. Heart regular rate and rhythm.  Abdomen is soft. Bowel sounds are positive. No hepatomegaly. No abdominal masses felt. No tenderness. 1 edema to left  lower extremities. No edema to rt lower leg.   Assessment: Increase bilirubin in urine. Will get an hepatic function. Will discuss with Dr. Laural Golden

## 2017-01-07 NOTE — Patient Instructions (Signed)
Cmet today 

## 2017-01-09 ENCOUNTER — Telehealth (INDEPENDENT_AMBULATORY_CARE_PROVIDER_SITE_OTHER): Payer: Self-pay | Admitting: Internal Medicine

## 2017-01-09 NOTE — Telephone Encounter (Signed)
Results left on answering machine 

## 2017-01-09 NOTE — Telephone Encounter (Signed)
Patient called, lmoam that he would like his lab results.  [phone]

## 2017-01-14 NOTE — Telephone Encounter (Signed)
Results given to patient. No further work up. I discussed with Dr Laural Golden

## 2017-03-10 ENCOUNTER — Other Ambulatory Visit (INDEPENDENT_AMBULATORY_CARE_PROVIDER_SITE_OTHER): Payer: Self-pay | Admitting: Internal Medicine

## 2017-03-10 DIAGNOSIS — K219 Gastro-esophageal reflux disease without esophagitis: Secondary | ICD-10-CM

## 2017-03-13 ENCOUNTER — Encounter (HOSPITAL_COMMUNITY): Payer: Self-pay

## 2017-03-13 ENCOUNTER — Emergency Department (HOSPITAL_COMMUNITY): Payer: BLUE CROSS/BLUE SHIELD

## 2017-03-13 ENCOUNTER — Emergency Department (HOSPITAL_COMMUNITY)
Admission: EM | Admit: 2017-03-13 | Discharge: 2017-03-14 | Disposition: A | Discharge status: Home / Self Care | Payer: BLUE CROSS/BLUE SHIELD | Attending: Emergency Medicine | Admitting: Emergency Medicine

## 2017-03-13 DIAGNOSIS — N189 Chronic kidney disease, unspecified: Secondary | ICD-10-CM | POA: Insufficient documentation

## 2017-03-13 DIAGNOSIS — Y929 Unspecified place or not applicable: Secondary | ICD-10-CM | POA: Diagnosis not present

## 2017-03-13 DIAGNOSIS — Y939 Activity, unspecified: Secondary | ICD-10-CM | POA: Diagnosis not present

## 2017-03-13 DIAGNOSIS — S82842A Displaced bimalleolar fracture of left lower leg, initial encounter for closed fracture: Secondary | ICD-10-CM

## 2017-03-13 DIAGNOSIS — E1122 Type 2 diabetes mellitus with diabetic chronic kidney disease: Secondary | ICD-10-CM | POA: Diagnosis not present

## 2017-03-13 DIAGNOSIS — W11XXXA Fall on and from ladder, initial encounter: Secondary | ICD-10-CM | POA: Diagnosis not present

## 2017-03-13 DIAGNOSIS — Y999 Unspecified external cause status: Secondary | ICD-10-CM | POA: Diagnosis not present

## 2017-03-13 DIAGNOSIS — M25572 Pain in left ankle and joints of left foot: Secondary | ICD-10-CM | POA: Diagnosis not present

## 2017-03-13 DIAGNOSIS — R52 Pain, unspecified: Secondary | ICD-10-CM | POA: Insufficient documentation

## 2017-03-13 DIAGNOSIS — I129 Hypertensive chronic kidney disease with stage 1 through stage 4 chronic kidney disease, or unspecified chronic kidney disease: Secondary | ICD-10-CM | POA: Diagnosis not present

## 2017-03-13 DIAGNOSIS — Z7984 Long term (current) use of oral hypoglycemic drugs: Secondary | ICD-10-CM | POA: Diagnosis not present

## 2017-03-13 DIAGNOSIS — S9302XA Subluxation of left ankle joint, initial encounter: Secondary | ICD-10-CM | POA: Diagnosis not present

## 2017-03-13 DIAGNOSIS — S99912A Unspecified injury of left ankle, initial encounter: Secondary | ICD-10-CM | POA: Diagnosis not present

## 2017-03-13 DIAGNOSIS — F17210 Nicotine dependence, cigarettes, uncomplicated: Secondary | ICD-10-CM | POA: Insufficient documentation

## 2017-03-13 DIAGNOSIS — J45909 Unspecified asthma, uncomplicated: Secondary | ICD-10-CM | POA: Insufficient documentation

## 2017-03-13 DIAGNOSIS — Z7982 Long term (current) use of aspirin: Secondary | ICD-10-CM | POA: Diagnosis not present

## 2017-03-13 LAB — CBC WITH DIFFERENTIAL/PLATELET
Basophils Absolute: 0 10*3/uL (ref 0.0–0.1)
Basophils Relative: 1 %
Eosinophils Absolute: 0.5 10*3/uL (ref 0.0–0.7)
Eosinophils Relative: 6 %
HEMATOCRIT: 35.5 % — AB (ref 39.0–52.0)
HEMOGLOBIN: 11.5 g/dL — AB (ref 13.0–17.0)
Lymphocytes Relative: 20 %
Lymphs Abs: 1.7 10*3/uL (ref 0.7–4.0)
MCH: 30.7 pg (ref 26.0–34.0)
MCHC: 32.4 g/dL (ref 30.0–36.0)
MCV: 94.7 fL (ref 78.0–100.0)
MONOS PCT: 5 %
Monocytes Absolute: 0.4 10*3/uL (ref 0.1–1.0)
NEUTROS ABS: 5.6 10*3/uL (ref 1.7–7.7)
NEUTROS PCT: 68 %
Platelets: 259 10*3/uL (ref 150–400)
RBC: 3.75 MIL/uL — AB (ref 4.22–5.81)
RDW: 14.5 % (ref 11.5–15.5)
WBC: 8.2 10*3/uL (ref 4.0–10.5)

## 2017-03-13 MED ORDER — FENTANYL CITRATE (PF) 100 MCG/2ML IJ SOLN
100.0000 ug | Freq: Once | INTRAMUSCULAR | Status: AC
  Administered 2017-03-13: 100 ug via INTRAVENOUS
  Filled 2017-03-13: qty 2

## 2017-03-13 MED ORDER — ONDANSETRON HCL 4 MG/2ML IJ SOLN
4.0000 mg | Freq: Once | INTRAMUSCULAR | Status: AC
Start: 2017-03-13 — End: 2017-03-13
  Administered 2017-03-13: 4 mg via INTRAVENOUS
  Filled 2017-03-13: qty 2

## 2017-03-13 NOTE — ED Provider Notes (Signed)
[facility] EMERGENCY DEPARTMENT Provider Note   CSN: [id number] Arrival date & time: 03/13/17  2311     History   Chief Complaint Chief Complaint  Patient presents with  . Ankle Injury    HPI Reginald Stephens is a 67 y.o. male.  The history is provided by the patient and the spouse.  Ankle Injury  This is a new problem. The current episode started 1 to 2 hours ago. The problem occurs constantly. The problem has been gradually worsening. Pertinent negatives include no chest pain, no abdominal pain and no headaches. The symptoms are aggravated by walking. The symptoms are relieved by rest. He has tried rest for the symptoms. The treatment provided no relief.   Patient reports about 2 hours ago he fell from a 6 foot ladder landing on his left foot. He was attempting to get his small Tree. Due to the darkness, he lost his footing and landed on his left foot He denies any head injury, no LOC He denies headache/neck pain/back pain/chest pain He denies any pain in his hips Last meal was about 3 hours prior to arrival Past Medical History:  Diagnosis Date  . Arthritis   . Asthma   . Chronic kidney disease   . Diabetes mellitus   . GERD (gastroesophageal reflux disease)   . Hyperlipidemia   . Hypertension   . Palpitations     Patient Active Problem List   Diagnosis Date Noted  . Palpitations 03/01/2015  . Essential hypertension 08/25/2012  . Hyperlipidemia 08/25/2012  . Diabetes ([place]) 08/25/2012  . Tobacco abuse 08/25/2012    Past Surgical History:  Procedure Laterality Date  . COLONOSCOPY  02/15/2011   Procedure: COLONOSCOPY;  Surgeon: Rogene Houston, MD;  [facility];  Service: Endoscopy;  Laterality: N/A;  . CYSTOSCOPY  2011  . ESOPHAGEAL DILATION N/A 07/08/2014   Procedure: ESOPHAGEAL DILATION;  Surgeon: Rogene Houston, MD;  Location: [facility];  Service: Endoscopy;  Laterality: N/A;  . ESOPHAGOGASTRODUODENOSCOPY N/A 07/08/2014   Procedure:  ESOPHAGOGASTRODUODENOSCOPY (EGD);  Surgeon: Rogene Houston, MD;  Location: [facility];  Service: Endoscopy;  Laterality: N/A;  245  . TONSILLECTOMY     age 12  . TONSILLECTOMY         Home Medications    Prior to Admission medications   Medication Sig Start Date End Date Taking? Authorizing Provider  aspirin EC 81 MG tablet Take 81 mg by mouth daily.      [provider]  atorvastatin (LIPITOR) 20 MG tablet Take 20 mg by mouth every morning.      [provider]  DEXILANT 60 MG capsule TAKE 1 CAPSULE (60 MG TOTAL) BY MOUTH DAILY. 03/11/17   Setzer, Rona Ravens, NP  diltiazem (CARTIA XT) 300 MG 24 hr capsule Take 300 mg by mouth daily.      [provider]  lisinopril-hydrochlorothiazide (PRINZIDE,ZESTORETIC) 20-25 MG per tablet Take 1 tablet by mouth daily.  08/10/12   [provider]  metFORMIN (GLUCOPHAGE) 1000 MG tablet Take 1,000 mg by mouth 2 (two) times daily with a meal.      [provider]    Family History Family History  Problem Relation Age of Onset  . Heart attack Maternal Grandmother   . Stroke Maternal Grandfather     Social History Social History   Tobacco Use  . Smoking status: Current Every Day Smoker    Packs/day: 1.00    Years: 25.00  Pack years: 25.00    Types: Cigarettes  . Smokeless tobacco: Never Used  Substance Use Topics  . Alcohol use: Yes    Alcohol/week: 7.2 oz    Types: 12 Cans of beer per week  . Drug use: No     Allergies   Shellfish allergy   Review of Systems Review of Systems  Cardiovascular: Negative for chest pain.  Gastrointestinal: Negative for abdominal pain.  Musculoskeletal: Positive for arthralgias and joint swelling. Negative for back pain and neck pain.  Neurological: Negative for headaches.  All other systems reviewed and are negative.    Physical Exam Updated Vital Signs BP 109/65 (BP Location: Right Arm)   Pulse 83   Temp 98.2 F (36.8 C) (Oral)   Resp 20   Ht  1.778 m (5\' 10" )   Wt 103.4 kg (228 lb)   SpO2 95%   BMI 32.71 kg/m   Physical Exam CONSTITUTIONAL: Well developed/well nourished HEAD: Normocephalic/atraumatic EYES: EOMI/PERRL ENMT: Mucous membranes moist NECK: supple no meningeal signs SPINE/BACK:entire spine nontender, no lumbar tenderness, No bruising/crepitance/stepoffs noted to spine CV: S1/S2 noted, no murmurs/rubs/gallops noted LUNGS: Lungs are clear to auscultation bilaterally, no apparent distress ABDOMEN: soft, nontender, no rebound or guarding, bowel sounds noted throughout abdomen GU:no cva tenderness NEURO: Pt is awake/alert/appropriate, moves all extremitiesx4.  No facial droop.   EXTREMITIES: pulses normal/equal in both feet, tenderness and deformity to left ankle, soft tissue swelling throughout left ankle and left foot, no lacerations.  No tenderness to left proximal fibula or left knee.  pelvis is stable, all other extremities/joints palpated/ranged and nontender SKIN: warm, color normal PSYCH: Anxious ED Treatments / Results  Labs (all labs ordered are listed, but only abnormal results are displayed) Labs Reviewed  BASIC METABOLIC PANEL - Abnormal; Notable for the following components:      Result Value   Potassium 3.4 (*)    CO2 14 (*)    Glucose, Bld 107 (*)    BUN 21 (*)    Anion gap >20 (*)    All other components within normal limits  CBC WITH DIFFERENTIAL/PLATELET - Abnormal; Notable for the following components:   RBC 3.75 (*)    Hemoglobin 11.5 (*)    HCT 35.5 (*)    All other components within normal limits  PROTIME-INR  TYPE AND SCREEN    EKG  EKG Interpretation None       Radiology Dg Ankle Complete Left  Result Date: 03/14/2017 CLINICAL DATA:  Patient fell off ladder.  Ankle pain. EXAM: LEFT ANKLE COMPLETE - 3+ VIEW COMPARISON:  None. FINDINGS: A comminuted, laterally angulated fracture of the distal diaphysis of the fibula is identified. There is 1/4 shaft width anterior  displacement of the distal fracture fragment. There is lateral subluxation of the talar dome relative to the tibial plafond associated with a transverse displaced fracture of the medial malleolus and fracture of the posterolateral corner of the tibia likely involving small portion of the posterior malleolus. The talus appears intact. The subtalar joint is maintained. Degenerative spurring off the dorsum of the midfoot is noted. Calcaneal enthesopathy is seen. Periarticular soft tissue swelling is noted about the malleoli. IMPRESSION: 1. Comminuted laterally angulated distal diaphyseal fracture of the fibula with slight 1/4 shaft width anterior displacement of the distal fracture fragment 2. Transverse, laterally displaced fracture of the medial malleolus. 3. Laterally subluxed talar dome relative to the tibial plafond. 4. Posterolateral corner fracture of the tibial epiphysis likely involving a portion of the posterior  malleolus. 5. Calcaneal enthesopathy. Electronically Signed   By: Ashley Royalty M.D.   On: 03/14/2017 01:03   Dg Ankle Left Port  Result Date: 03/14/2017 CLINICAL DATA:  Left ankle fracture post casting. EXAM: PORTABLE LEFT ANKLE - 2 VIEW COMPARISON:  None. FINDINGS: New fiberglass cast is seen about a known fracture of the left ankle. Widening of the medial clear space up to 18 mm is noted with lateral subluxation of the talar dome relative to the tibial plafond, minimally decreased from approximately 20 mm. A transverse fracture of the medial malleolus with lateral displacement, comminuted laterally angulated fracture of the distal diaphysis of the fibula as well as a posterolateral corner fracture of the tibial plafond is seen as before. IMPRESSION: Acute fracture-subluxation about the ankle joint as seen through fiberglass. Electronically Signed   By: Ashley Royalty M.D.   On: 03/14/2017 02:46   Dg Foot Complete Left  Result Date: 03/14/2017 CLINICAL DATA:  Pain after fall from ladder. EXAM:  LEFT FOOT - COMPLETE 3+ VIEW COMPARISON:  Same day ankle radiographs. FINDINGS: Please see the ankle report for details of an acute fracture-subluxation of the ankle involving the distal diaphysis of the fibula, medial malleolus and posterolateral corner of the tibial epiphysis with lateral subluxation of the talar dome relative to the tibial plafond. The calcaneus appears intact. There are calcaneal enthesophytes along the plantar dorsal aspect. The midfoot articulations are maintained. No acute osseous abnormality of the foot. Periarticular soft tissue swelling is seen about the ankle. IMPRESSION: 1. No acute osseous abnormality of the foot is identified. 2. Acute fracture-subluxation of the ankle joint described in the ankle report. Electronically Signed   By: Ashley Royalty M.D.   On: 03/14/2017 01:08    Procedures Procedures  SPLINT APPLICATION Date/Time: 1:61 AM Authorized by: Sharyon Cable Consent: Verbal consent obtained. Risks and benefits: risks, benefits and alternatives were discussed Consent given by: patient Splint applied by: orthopedic technician/nurse Location details: left lower extremity Splint type: posterior/stirrup Supplies used: ortho glass Post-procedure: The splinted body part was neurovascularly unchanged following the procedure. Patient tolerance: Patient tolerated the procedure well with no immediate complications.     Medications Ordered in ED Medications  fentaNYL (SUBLIMAZE) injection 100 mcg (100 mcg Intravenous Given 03/13/17 2358)  ondansetron (ZOFRAN) injection 4 mg (4 mg Intravenous Given 03/13/17 2358)  sodium chloride 0.9 % bolus 1,000 mL (0 mLs Intravenous Stopped 03/14/17 0357)  sodium chloride 0.9 % bolus 1,000 mL (0 mLs Intravenous Stopped 03/14/17 0230)  fentaNYL (SUBLIMAZE) injection 50 mcg (50 mcg Intravenous Given 03/14/17 0130)  fentaNYL (SUBLIMAZE) injection 25 mcg (25 mcg Intravenous Given 03/14/17 0408)  ondansetron (ZOFRAN) injection 4 mg (4 mg  Intravenous Given 03/14/17 0406)     Initial Impression / Assessment and Plan / ED Course  I have reviewed the triage vital signs and the nursing notes.  Pertinent labs & imaging results that were available during my care of the patient were reviewed by me and considered in my medical decision making (see chart for details). Narcotic database reviewed and considered in decision making    1:26 AM Discussed case with Dr. Arther Abbott with orthopedics He has reviewed x-rays, and I have discussed his exam findings Patient can be admitted for operative repair, or if he wishes he can be discharged and follow-up on January 14 After discussion with patient and his wife, they wish to go home, with splinting crutches and follow-up as an outpatient He was noted to be dehydrated,  will give IV fluids 4:10 AM I Offered to patient multiple times that he could be admitted to the hospital. However he feels that he is safe for discharge.  He has been able to use the crutches here in the emergency department 3-day course of narcotics has been prescribed I discussed need to rest, elevate leg frequently. He will call the orthopedic office today for follow-up early next week  Final Clinical Impressions(s) / ED Diagnoses   Final diagnoses:  Closed bimalleolar fracture of left ankle, initial encounter  Pain    ED Discharge Orders        Ordered    HYDROcodone-acetaminophen (NORCO/VICODIN) 5-325 MG tablet  Every 6 hours PRN     03/14/17 0401       Ripley Fraise, MD 03/14/17 0413

## 2017-03-13 NOTE — ED Triage Notes (Signed)
Pt fell from a 6 ft ladder while trying to get a cat out of a tree, has pain and swelling to left ankle.  Pt denies other pain

## 2017-03-14 ENCOUNTER — Emergency Department (HOSPITAL_COMMUNITY): Payer: BLUE CROSS/BLUE SHIELD

## 2017-03-14 DIAGNOSIS — S9302XA Subluxation of left ankle joint, initial encounter: Secondary | ICD-10-CM | POA: Diagnosis not present

## 2017-03-14 LAB — BASIC METABOLIC PANEL
BUN: 21 mg/dL — ABNORMAL HIGH (ref 6–20)
CHLORIDE: 101 mmol/L (ref 101–111)
CO2: 14 mmol/L — AB (ref 22–32)
Calcium: 9.4 mg/dL (ref 8.9–10.3)
Creatinine, Ser: 0.9 mg/dL (ref 0.61–1.24)
GFR calc non Af Amer: >60 mL/min (ref >60)
Glucose, Bld: 107 mg/dL — ABNORMAL HIGH (ref 65–99)
POTASSIUM: 3.4 mmol/L — AB (ref 3.5–5.1)
SODIUM: 137 mmol/L (ref 135–145)

## 2017-03-14 LAB — TYPE AND SCREEN
ABO/RH(D): O POS
Antibody Screen: NEGATIVE

## 2017-03-14 LAB — PROTIME-INR
INR: 0.91
PROTHROMBIN TIME: 12.1 s (ref 11.4–15.2)

## 2017-03-14 MED ORDER — ONDANSETRON HCL 4 MG/2ML IJ SOLN
4.0000 mg | Freq: Once | INTRAMUSCULAR | Status: AC
  Administered 2017-03-14: 4 mg via INTRAVENOUS

## 2017-03-14 MED ORDER — HYDROCODONE-ACETAMINOPHEN 5-325 MG PO TABS: 1.0000 | ORAL_TABLET | Freq: Four times a day (QID) | ORAL | 0 refills | Status: DC | PRN

## 2017-03-14 MED ORDER — FENTANYL CITRATE (PF) 100 MCG/2ML IJ SOLN
50.0000 ug | Freq: Once | INTRAMUSCULAR | Status: AC
  Administered 2017-03-14: 50 ug via INTRAVENOUS
  Filled 2017-03-14: qty 2

## 2017-03-14 MED ORDER — ONDANSETRON HCL 4 MG/2ML IJ SOLN
INTRAMUSCULAR | Status: AC
  Filled 2017-03-14: qty 2

## 2017-03-14 MED ORDER — FENTANYL CITRATE (PF) 100 MCG/2ML IJ SOLN
25.0000 ug | Freq: Once | INTRAMUSCULAR | Status: AC
  Administered 2017-03-14: 25 ug via INTRAVENOUS
  Filled 2017-03-14: qty 2

## 2017-03-14 MED ORDER — SODIUM CHLORIDE 0.9 % IV BOLUS (SEPSIS)
1000.0000 mL | Freq: Once | INTRAVENOUS | Status: AC
  Administered 2017-03-14: 1000 mL via INTRAVENOUS

## 2017-03-14 NOTE — ED Notes (Addendum)
Pt able to ambulate with crutches and demonstrated proper use of crutches during ambulation. Pt was dizzy upon sitting and standing initially, Dr Christy Gentles aware.

## 2017-03-14 NOTE — ED Notes (Signed)
Patient transported to X-ray 

## 2017-03-18 ENCOUNTER — Encounter: Payer: Self-pay | Admitting: Orthopedic Surgery

## 2017-03-18 ENCOUNTER — Ambulatory Visit: Payer: BLUE CROSS/BLUE SHIELD | Admitting: Orthopedic Surgery

## 2017-03-18 ENCOUNTER — Encounter (HOSPITAL_COMMUNITY): Payer: Self-pay

## 2017-03-18 ENCOUNTER — Telehealth: Payer: Self-pay | Admitting: Radiology

## 2017-03-18 ENCOUNTER — Other Ambulatory Visit: Payer: Self-pay

## 2017-03-18 ENCOUNTER — Encounter (HOSPITAL_COMMUNITY)
Admission: RE | Admit: 2017-03-18 | Discharge: 2017-03-18 | Disposition: A | Discharge status: Home / Self Care | Payer: BLUE CROSS/BLUE SHIELD | Source: Ambulatory Visit | Attending: Orthopedic Surgery | Admitting: Orthopedic Surgery

## 2017-03-18 VITALS — BP 146/96 | HR 71 | Ht 70.0 in | Wt 228.0 lb

## 2017-03-18 DIAGNOSIS — S82842A Displaced bimalleolar fracture of left lower leg, initial encounter for closed fracture: Secondary | ICD-10-CM | POA: Diagnosis not present

## 2017-03-18 HISTORY — DX: Personal history of urinary calculi: Z87.442

## 2017-03-18 MED ORDER — HYDROCODONE-ACETAMINOPHEN 7.5-325 MG PO TABS: 1.0000 | ORAL_TABLET | Freq: Four times a day (QID) | ORAL | 0 refills | Status: DC | PRN

## 2017-03-18 NOTE — Progress Notes (Signed)
   03/18/17 1349  OBSTRUCTIVE SLEEP APNEA  Have you ever been diagnosed with sleep apnea through a sleep study? No  Do you snore loudly (loud enough to be heard through closed doors)?  1  Do you often feel tired, fatigued, or sleepy during the daytime (such as falling asleep during driving or talking to someone)? 0  Has anyone observed you stop breathing during your sleep? 1  Do you have, or are you being treated for high blood pressure? 1  BMI more than 35 kg/m2? 0  Age > 50 (1-yes) 1  Neck circumference greater than:Male 16 inches or larger, Male 17inches or larger? 0  Male Gender (Yes=1) 1  Obstructive Sleep Apnea Score 5  Score 5 or greater  Results sent to PCP

## 2017-03-18 NOTE — Telephone Encounter (Signed)
I called [employer] [place] today for Auth of Ankle ORIF [id number] / no prior Josem Kaufmann is required per Thurmond Butts R. Ref number for the call is [id number]

## 2017-03-18 NOTE — Patient Instructions (Signed)
Steps to Quit Smoking Smoking tobacco can be bad for your health. It can also affect almost every organ in your body. Smoking puts you and people around you at risk for many serious long-lasting (chronic) diseases. Quitting smoking is hard, but it is one of the best things that you can do for your health. It is never too late to quit. What are the benefits of quitting smoking? When you quit smoking, you lower your risk for getting serious diseases and conditions. They can include:  Lung cancer or lung disease.  Heart disease.  Stroke.  Heart attack.  Not being able to have children (infertility).  Weak bones (osteoporosis) and broken bones (fractures).  If you have coughing, wheezing, and shortness of breath, those symptoms may get better when you quit. You may also get sick less often. If you are pregnant, quitting smoking can help to lower your chances of having a baby of low birth weight. What can I do to help me quit smoking? Talk with your doctor about what can help you quit smoking. Some things you can do (strategies) include:  Quitting smoking totally, instead of slowly cutting back how much you smoke over a period of time.  Going to in-person counseling. You are more likely to quit if you go to many counseling sessions.  Using resources and support systems, such as: ? Online chats with a counselor. ? Phone quitlines. ? Printed self-help materials. ? Support groups or group counseling. ? Text messaging programs. ? Mobile phone apps or applications.  Taking medicines. Some of these medicines may have nicotine in them. If you are pregnant or breastfeeding, do not take any medicines to quit smoking unless your doctor says it is okay. Talk with your doctor about counseling or other things that can help you.  Talk with your doctor about using more than one strategy at the same time, such as taking medicines while you are also going to in-person counseling. This can help make  quitting easier. What things can I do to make it easier to quit? Quitting smoking might feel very hard at first, but there is a lot that you can do to make it easier. Take these steps:  Talk to your family and friends. Ask them to support and encourage you.  Call phone quitlines, reach out to support groups, or work with a counselor.  Ask people who smoke to not smoke around you.  Avoid places that make you want (trigger) to smoke, such as: ? Bars. ? Parties. ? Smoke-break areas at work.  Spend time with people who do not smoke.  Lower the stress in your life. Stress can make you want to smoke. Try these things to help your stress: ? Getting regular exercise. ? Deep-breathing exercises. ? Yoga. ? Meditating. ? Doing a body scan. To do this, close your eyes, focus on one area of your body at a time from head to toe, and notice which parts of your body are tense. Try to relax the muscles in those areas.  Download or buy apps on your mobile phone or tablet that can help you stick to your quit plan. There are many free apps, such as QuitGuide from the CDC (Centers for Disease Control and Prevention). You can find more support from smokefree.gov and other websites.  This information is not intended to replace advice given to you by your health care provider. Make sure you discuss any questions you have with your health care provider. Document Released: 12/16/2008 Document   Revised: 10/18/2015 Document Reviewed: 07/06/2014 Elsevier Interactive Patient Education  2018 Elsevier Inc.  

## 2017-03-18 NOTE — Progress Notes (Signed)
NEW PATIENT OFFICE VISIT    Chief Complaint  Patient presents with  . Ankle Pain    fell off ladder    67 year old male works at [employer] improvement injured his left ankle while trying to get his cat out of a tree.  He lost his balance on a ladder fell on down to his left leg presented to the ER with pain and deformity.  X-rays show bimalleolar displaced left ankle fracture  He complains of mild to moderate dull aching lateral and medial left ankle pain times 5 days with a date of injury recorded as March 13, 2016 place of injury home mechanism fall  He is diabetic has hypertension and is a smoker    Review of Systems  Constitutional: Negative for chills, fever and weight loss.  Respiratory: Negative for shortness of breath.   Neurological: Negative for tingling and sensory change.     Past Medical History:  Diagnosis Date  . Arthritis   . Asthma   . Chronic kidney disease   . Diabetes mellitus   . GERD (gastroesophageal reflux disease)   . Hyperlipidemia   . Hypertension   . Palpitations     Past Surgical History:  Procedure Laterality Date  . COLONOSCOPY  02/15/2011   Procedure: COLONOSCOPY;  Surgeon: Rogene Houston, MD;  [facility];  Service: Endoscopy;  Laterality: N/A;  . CYSTOSCOPY  2011  . ESOPHAGEAL DILATION N/A 07/08/2014   Procedure: ESOPHAGEAL DILATION;  Surgeon: Rogene Houston, MD;  [facility];  Service: Endoscopy;  Laterality: N/A;  . ESOPHAGOGASTRODUODENOSCOPY N/A 07/08/2014   Procedure: ESOPHAGOGASTRODUODENOSCOPY (EGD);  Surgeon: Rogene Houston, MD;  [facility];  Service: Endoscopy;  Laterality: N/A;  245  . TONSILLECTOMY     age 89  . TONSILLECTOMY      Family History  Problem Relation Age of Onset  . Heart attack Maternal Grandmother   . Stroke Maternal Grandfather    Social History   Tobacco Use  . Smoking status: Current Every Day Smoker    Packs/day: 1.00    Years: 25.00    Pack years: 25.00     Types: Cigarettes  . Smokeless tobacco: Never Used  Substance Use Topics  . Alcohol use: Yes    Alcohol/week: 7.2 oz    Types: 12 Cans of beer per week  . Drug use: No      Current Meds  Medication Sig  . aspirin EC 81 MG tablet Take 81 mg by mouth daily.    Marland Kitchen atorvastatin (LIPITOR) 20 MG tablet Take 20 mg by mouth every morning.    . Cholecalciferol (VITAMIN D3) 50000 units CAPS TAKE ONE CAPSULE BY MOUTH A WEEK  . DEXILANT 60 MG capsule TAKE 1 CAPSULE (60 MG TOTAL) BY MOUTH DAILY.  Marland Kitchen diltiazem (CARTIA XT) 300 MG 24 hr capsule Take 300 mg by mouth daily.    . fluticasone (FLONASE) 50 MCG/ACT nasal spray USE 2 SPRAYS EACH NOSTRILS DAILY  . lisinopril-hydrochlorothiazide (PRINZIDE,ZESTORETIC) 20-25 MG per tablet Take 1 tablet by mouth daily.   . metFORMIN (GLUCOPHAGE) 1000 MG tablet Take 1,000 mg by mouth 2 (two) times daily with a meal.    . [DISCONTINUED] HYDROcodone-acetaminophen (NORCO/VICODIN) 5-325 MG tablet Take 1 tablet by mouth every 6 (six) hours as needed for severe pain.    BP (!) 146/96   Pulse 71   Ht 5\' 10"  (1.778 m)   Wt 228 lb (103.4 kg)   BMI 32.71  kg/m   Physical Exam  Constitutional: He is oriented to person, place, and time. He appears well-developed and well-nourished.  Vital signs have been reviewed and are stable. Gen. appearance the patient is well-developed and well-nourished with normal grooming and hygiene.   Musculoskeletal:       Right upper arm: Normal.       Left upper arm: Normal.       Right lower leg: Normal.  Gait is abnormal he cannot weight-bear on his left leg and is on crutches and in a wheelchair  Neurological: He is alert and oriented to person, place, and time.  Skin: Skin is warm and dry. No erythema.  Psychiatric: He has a normal mood and affect.  Vitals reviewed.   Left Ankle Exam   Tenderness  The patient is experiencing tenderness in the deltoid, lateral malleolus and medial malleolus.  Swelling: moderate  Range of  Motion  Dorsiflexion: abnormal  Plantar flexion: abnormal  Eversion: abnormal  Inversion: abnormal   Muscle Strength  Left ankle normal muscle strength: All areas are weak but primarily secondary to pain.  Tests  Anterior drawer: negative Varus tilt: negative  Other  Erythema: absent Scars: absent Sensation: normal Pulse: present  Comments:  Abrasion near the medial side of the ankle      Meds ordered this encounter  Medications  . HYDROcodone-acetaminophen (NORCO) 7.5-325 MG tablet    Sig: Take 1 tablet by mouth every 6 (six) hours as needed for moderate pain.    Dispense:  42 tablet    Refill:  0    Encounter Diagnosis  Name Primary?  . Closed bimalleolar fracture of left ankle, initial encounter Yes     PLAN:   Plan is to do an open reduction internal fixation of the left ankle with syndesmosis screw  The procedure has been fully reviewed with the patient; The risks and benefits of surgery have been discussed and explained and understood. Alternative treatment has also been reviewed, questions were encouraged and answered. The postoperative plan is also been reviewed.  I reviewed with the patient that he has 2 major risk factors for increased complications one is diabetic and to he smokes.  We discussed that he would have a minimal 12 weeks of nonweightbearing  At the 12-week point we can discuss possible knee walker and return to work

## 2017-03-19 ENCOUNTER — Ambulatory Visit (HOSPITAL_COMMUNITY): Payer: BLUE CROSS/BLUE SHIELD | Admitting: Anesthesiology

## 2017-03-19 ENCOUNTER — Encounter (HOSPITAL_COMMUNITY)
Admission: RE | Disposition: A | Discharge status: Home / Self Care | Payer: Self-pay | Source: Ambulatory Visit | Attending: Orthopedic Surgery

## 2017-03-19 ENCOUNTER — Ambulatory Visit (HOSPITAL_COMMUNITY): Payer: BLUE CROSS/BLUE SHIELD

## 2017-03-19 ENCOUNTER — Ambulatory Visit (HOSPITAL_COMMUNITY)
Admission: RE | Admit: 2017-03-19 | Discharge: 2017-03-20 | Disposition: A | Discharge status: Home / Self Care | Payer: BLUE CROSS/BLUE SHIELD | Source: Ambulatory Visit | Attending: Orthopedic Surgery | Admitting: Orthopedic Surgery

## 2017-03-19 ENCOUNTER — Encounter (HOSPITAL_COMMUNITY): Payer: Self-pay | Admitting: *Deleted

## 2017-03-19 DIAGNOSIS — J45909 Unspecified asthma, uncomplicated: Secondary | ICD-10-CM | POA: Diagnosis not present

## 2017-03-19 DIAGNOSIS — Y92512 Supermarket, store or market as the place of occurrence of the external cause: Secondary | ICD-10-CM | POA: Diagnosis not present

## 2017-03-19 DIAGNOSIS — Z01818 Encounter for other preprocedural examination: Secondary | ICD-10-CM | POA: Diagnosis not present

## 2017-03-19 DIAGNOSIS — F17210 Nicotine dependence, cigarettes, uncomplicated: Secondary | ICD-10-CM | POA: Diagnosis not present

## 2017-03-19 DIAGNOSIS — S82842D Displaced bimalleolar fracture of left lower leg, subsequent encounter for closed fracture with routine healing: Secondary | ICD-10-CM | POA: Diagnosis not present

## 2017-03-19 DIAGNOSIS — S82842A Displaced bimalleolar fracture of left lower leg, initial encounter for closed fracture: Secondary | ICD-10-CM | POA: Diagnosis not present

## 2017-03-19 DIAGNOSIS — S93432A Sprain of tibiofibular ligament of left ankle, initial encounter: Secondary | ICD-10-CM | POA: Diagnosis not present

## 2017-03-19 DIAGNOSIS — Y990 Civilian activity done for income or pay: Secondary | ICD-10-CM | POA: Insufficient documentation

## 2017-03-19 DIAGNOSIS — I129 Hypertensive chronic kidney disease with stage 1 through stage 4 chronic kidney disease, or unspecified chronic kidney disease: Secondary | ICD-10-CM | POA: Diagnosis not present

## 2017-03-19 DIAGNOSIS — Z79899 Other long term (current) drug therapy: Secondary | ICD-10-CM | POA: Diagnosis not present

## 2017-03-19 DIAGNOSIS — E785 Hyperlipidemia, unspecified: Secondary | ICD-10-CM | POA: Diagnosis not present

## 2017-03-19 DIAGNOSIS — Z7984 Long term (current) use of oral hypoglycemic drugs: Secondary | ICD-10-CM | POA: Insufficient documentation

## 2017-03-19 DIAGNOSIS — S82899A Other fracture of unspecified lower leg, initial encounter for closed fracture: Secondary | ICD-10-CM

## 2017-03-19 DIAGNOSIS — W11XXXA Fall on and from ladder, initial encounter: Secondary | ICD-10-CM | POA: Insufficient documentation

## 2017-03-19 DIAGNOSIS — E1122 Type 2 diabetes mellitus with diabetic chronic kidney disease: Secondary | ICD-10-CM | POA: Diagnosis not present

## 2017-03-19 DIAGNOSIS — K219 Gastro-esophageal reflux disease without esophagitis: Secondary | ICD-10-CM | POA: Diagnosis not present

## 2017-03-19 DIAGNOSIS — N189 Chronic kidney disease, unspecified: Secondary | ICD-10-CM | POA: Insufficient documentation

## 2017-03-19 DIAGNOSIS — Z7982 Long term (current) use of aspirin: Secondary | ICD-10-CM | POA: Insufficient documentation

## 2017-03-19 DIAGNOSIS — S8292XD Unspecified fracture of left lower leg, subsequent encounter for closed fracture with routine healing: Secondary | ICD-10-CM | POA: Diagnosis not present

## 2017-03-19 HISTORY — PX: ORIF ANKLE FRACTURE: SHX5408

## 2017-03-19 LAB — GLUCOSE, CAPILLARY
GLUCOSE-CAPILLARY: 112 mg/dL — AB (ref 65–99)
Glucose-Capillary: 105 mg/dL — ABNORMAL HIGH (ref 65–99)

## 2017-03-19 SURGERY — OPEN REDUCTION INTERNAL FIXATION (ORIF) ANKLE FRACTURE
Anesthesia: General | Site: Ankle | Laterality: Left

## 2017-03-19 MED ORDER — SUGAMMADEX SODIUM 500 MG/5ML IV SOLN
INTRAVENOUS | Status: AC
  Filled 2017-03-19: qty 5

## 2017-03-19 MED ORDER — ROCURONIUM BROMIDE 50 MG/5ML IV SOLN
INTRAVENOUS | Status: AC
  Filled 2017-03-19: qty 1

## 2017-03-19 MED ORDER — PROPOFOL 10 MG/ML IV BOLUS
INTRAVENOUS | Status: DC | PRN
  Administered 2017-03-19: 170 mg via INTRAVENOUS

## 2017-03-19 MED ORDER — FENTANYL CITRATE (PF) 250 MCG/5ML IJ SOLN
INTRAMUSCULAR | Status: AC
  Filled 2017-03-19: qty 5

## 2017-03-19 MED ORDER — SUGAMMADEX SODIUM 500 MG/5ML IV SOLN
INTRAVENOUS | Status: DC | PRN
  Administered 2017-03-19: 300 mg via INTRAVENOUS

## 2017-03-19 MED ORDER — MIDAZOLAM HCL 2 MG/2ML IJ SOLN
INTRAMUSCULAR | Status: AC
  Filled 2017-03-19: qty 2

## 2017-03-19 MED ORDER — MIDAZOLAM HCL 2 MG/2ML IJ SOLN
1.0000 mg | INTRAMUSCULAR | Status: AC
  Administered 2017-03-19: 2 mg via INTRAVENOUS
  Filled 2017-03-19: qty 2

## 2017-03-19 MED ORDER — BUPIVACAINE-EPINEPHRINE (PF) 0.5% -1:200000 IJ SOLN
INTRAMUSCULAR | Status: AC
  Filled 2017-03-19: qty 60

## 2017-03-19 MED ORDER — HYDROMORPHONE HCL 1 MG/ML IJ SOLN: 0.2500 mg | INTRAMUSCULAR | Status: DC | PRN

## 2017-03-19 MED ORDER — 0.9 % SODIUM CHLORIDE (POUR BTL) OPTIME
TOPICAL | Status: DC | PRN
  Administered 2017-03-19: 1000 mL

## 2017-03-19 MED ORDER — DEXAMETHASONE SODIUM PHOSPHATE 4 MG/ML IJ SOLN
4.0000 mg | Freq: Once | INTRAMUSCULAR | Status: AC
  Administered 2017-03-19: 4 mg via INTRAVENOUS
  Filled 2017-03-19: qty 1

## 2017-03-19 MED ORDER — ONDANSETRON HCL 4 MG/2ML IJ SOLN
4.0000 mg | Freq: Once | INTRAMUSCULAR | Status: AC
  Administered 2017-03-19: 4 mg via INTRAVENOUS
  Filled 2017-03-19: qty 2

## 2017-03-19 MED ORDER — LACTATED RINGERS IV SOLN
INTRAVENOUS | Status: DC
  Administered 2017-03-19 (×2): via INTRAVENOUS
  Administered 2017-03-19: 1000 mL via INTRAVENOUS

## 2017-03-19 MED ORDER — FENTANYL CITRATE (PF) 100 MCG/2ML IJ SOLN
INTRAMUSCULAR | Status: AC
  Filled 2017-03-19: qty 2

## 2017-03-19 MED ORDER — CEFAZOLIN SODIUM-DEXTROSE 2-4 GM/100ML-% IV SOLN
2.0000 g | INTRAVENOUS | Status: AC
  Administered 2017-03-19: 2 g via INTRAVENOUS
  Filled 2017-03-19: qty 100

## 2017-03-19 MED ORDER — PROPOFOL 10 MG/ML IV BOLUS
INTRAVENOUS | Status: AC
  Filled 2017-03-19: qty 20

## 2017-03-19 MED ORDER — LIDOCAINE HCL (PF) 1 % IJ SOLN
INTRAMUSCULAR | Status: AC
  Filled 2017-03-19: qty 5

## 2017-03-19 MED ORDER — BUPIVACAINE-EPINEPHRINE (PF) 0.5% -1:200000 IJ SOLN
INTRAMUSCULAR | Status: DC | PRN
  Administered 2017-03-19: 60 mL via PERINEURAL

## 2017-03-19 MED ORDER — FENTANYL CITRATE (PF) 100 MCG/2ML IJ SOLN
INTRAMUSCULAR | Status: DC | PRN
  Administered 2017-03-19 (×2): 50 ug via INTRAVENOUS
  Administered 2017-03-19: 100 ug via INTRAVENOUS
  Administered 2017-03-19: 50 ug via INTRAVENOUS

## 2017-03-19 MED ORDER — LIDOCAINE HCL 1 % IJ SOLN
INTRAMUSCULAR | Status: DC | PRN
  Administered 2017-03-19: 35 mg via INTRADERMAL

## 2017-03-19 MED ORDER — CHLORHEXIDINE GLUCONATE 4 % EX LIQD: 60.0000 mL | Freq: Once | CUTANEOUS | Status: DC

## 2017-03-19 MED ORDER — ROCURONIUM BROMIDE 100 MG/10ML IV SOLN
INTRAVENOUS | Status: DC | PRN
  Administered 2017-03-19: 10 mg via INTRAVENOUS
  Administered 2017-03-19: 45 mg via INTRAVENOUS
  Administered 2017-03-19: 5 mg via INTRAVENOUS
  Administered 2017-03-19: 20 mg via INTRAVENOUS
  Administered 2017-03-19: 5 mg via INTRAVENOUS

## 2017-03-19 MED ORDER — MIDAZOLAM HCL 5 MG/5ML IJ SOLN
INTRAMUSCULAR | Status: DC | PRN
  Administered 2017-03-19: 2 mg via INTRAVENOUS

## 2017-03-19 SURGICAL SUPPLY — 65 items
BANDAGE ELASTIC 4 LF NS (GAUZE/BANDAGES/DRESSINGS) ×5 IMPLANT
BANDAGE ESMARK 4X12 BL STRL LF (DISPOSABLE) ×1 IMPLANT
BIT DRILL 3.5X122MM AO FIT (BIT) IMPLANT
BIT DRILL CANN 2.7 (BIT)
BIT DRILL SRG 2.7XCANN AO CPLG (BIT) IMPLANT
BIT DRL SRG 2.7XCANN AO CPLNG (BIT)
BLADE SURG SZ10 CARB STEEL (BLADE) ×2 IMPLANT
BNDG CMPR 12X4 ELC STRL LF (DISPOSABLE) ×1
BNDG CMPR MED 5X4 ELC HKLP NS (GAUZE/BANDAGES/DRESSINGS) ×3
BNDG COHESIVE 4X5 TAN STRL (GAUZE/BANDAGES/DRESSINGS) ×2 IMPLANT
BNDG ESMARK 4X12 BLUE STRL LF (DISPOSABLE) ×2
CHLORAPREP W/TINT 26ML (MISCELLANEOUS) ×3 IMPLANT
CLOTH BEACON ORANGE TIMEOUT ST (SAFETY) ×2 IMPLANT
COVER LIGHT HANDLE STERIS (MISCELLANEOUS) ×4 IMPLANT
CUFF TOURNIQUET SINGLE 34IN LL (TOURNIQUET CUFF) ×2 IMPLANT
DECANTER SPIKE VIAL GLASS SM (MISCELLANEOUS) ×4 IMPLANT
DRAPE C-ARM FOLDED MOBILE STRL (DRAPES) ×2 IMPLANT
DRAPE PROXIMA HALF (DRAPES) ×2 IMPLANT
DRILL 2.6X122MM WL AO SHAFT (BIT) ×1 IMPLANT
GAUZE SPONGE 4X4 12PLY STRL (GAUZE/BANDAGES/DRESSINGS) ×2 IMPLANT
GAUZE XEROFORM 5X9 LF (GAUZE/BANDAGES/DRESSINGS) ×2 IMPLANT
GLOVE BIOGEL M 7.0 STRL (GLOVE) ×1 IMPLANT
GLOVE BIOGEL PI IND STRL 6.5 (GLOVE) IMPLANT
GLOVE BIOGEL PI IND STRL 7.0 (GLOVE) ×2 IMPLANT
GLOVE BIOGEL PI INDICATOR 6.5 (GLOVE) ×1
GLOVE BIOGEL PI INDICATOR 7.0 (GLOVE) ×3
GLOVE INDICATOR 6.5 STRL GRN (GLOVE) ×1 IMPLANT
GLOVE SKINSENSE NS SZ8.0 LF (GLOVE) ×2
GLOVE SKINSENSE STRL SZ8.0 LF (GLOVE) ×1 IMPLANT
GLOVE SS N UNI LF 8.5 STRL (GLOVE) ×2 IMPLANT
GOWN STRL REUS W/TWL LRG LVL3 (GOWN DISPOSABLE) ×4 IMPLANT
GOWN STRL REUS W/TWL XL LVL3 (GOWN DISPOSABLE) ×2 IMPLANT
INST SET MINOR BONE (KITS) ×2 IMPLANT
K-WIRE ×5 IMPLANT
K-WIRE 1.6X150 (WIRE) ×10
K-WIRE FX150X1.6XKRSH (WIRE) ×5
KIT ROOM TURNOVER APOR (KITS) ×2 IMPLANT
KWIRE FX150X1.6XKRSH (WIRE) IMPLANT
MANIFOLD NEPTUNE II (INSTRUMENTS) ×2 IMPLANT
NDL HYPO 21X1.5 SAFETY (NEEDLE) ×1 IMPLANT
NEEDLE HYPO 21X1.5 SAFETY (NEEDLE) ×2 IMPLANT
NS IRRIG 1000ML POUR BTL (IV SOLUTION) ×2 IMPLANT
PACK BASIC LIMB (CUSTOM PROCEDURE TRAY) ×2 IMPLANT
PAD ABD 5X9 TENDERSORB (GAUZE/BANDAGES/DRESSINGS) ×4 IMPLANT
PAD ARMBOARD 7.5X6 YLW CONV (MISCELLANEOUS) ×2 IMPLANT
PAD CAST 4YDX4 CTTN HI CHSV (CAST SUPPLIES) ×1 IMPLANT
PADDING CAST COTTON 4X4 STRL (CAST SUPPLIES) ×4
PLATE 10H 132MM (Plate) ×1 IMPLANT
SCREW 46X4.0MM (Screw) ×1 IMPLANT
SCREW BONE 14MMX3.5MM (Screw) ×4 IMPLANT
SCREW BONE 3.5X16MM (Screw) ×2 IMPLANT
SCREW BONE 3.5X46 (Screw) ×1 IMPLANT
SCREW NONLOCK 60MM (Screw) ×1 IMPLANT
SET BASIN LINEN APH (SET/KITS/TRAYS/PACK) ×2 IMPLANT
SPLINT J IMMOBILIZER 4X20FT (CAST SUPPLIES) IMPLANT
SPLINT J PLASTER J 4INX20Y (CAST SUPPLIES) ×1
SPONGE LAP 18X18 X RAY DECT (DISPOSABLE) ×2 IMPLANT
STAPLER VISISTAT 35W (STAPLE) ×2 IMPLANT
SUT ETHILON 3 0 FSL (SUTURE) ×2 IMPLANT
SUT MON AB 0 CT1 (SUTURE) ×2 IMPLANT
SUT MON AB 2-0 CT1 36 (SUTURE) IMPLANT
SYR 30ML LL (SYRINGE) ×2 IMPLANT
SYR BULB IRRIGATION 50ML (SYRINGE) ×2 IMPLANT
TOWEL OR 17X26 4PK STRL BLUE (TOWEL DISPOSABLE) ×1 IMPLANT
WATER STERILE IRR 1000ML POUR (IV SOLUTION) ×1 IMPLANT

## 2017-03-19 NOTE — Interval H&P Note (Signed)
History and Physical Interval Note:  03/19/2017 1:48 PM  Reginald Stephens  has presented today for surgery, with the diagnosis of bimalleolar fracture left ankle  The various methods of treatment have been discussed with the patient and family. After consideration of risks, benefits and other options for treatment, the patient has consented to  Procedure(s): OPEN REDUCTION INTERNAL FIXATION (ORIF) LEFT ANKLE FRACTURE (Left) as a surgical intervention .  The patient's history has been reviewed, patient examined, no change in status, stable for surgery.  I have reviewed the patient's chart and labs.  Questions were answered to the patient's satisfaction.     Arther Abbott

## 2017-03-19 NOTE — Anesthesia Preprocedure Evaluation (Signed)
Anesthesia Evaluation  Patient identified by MRN, date of birth, ID band Patient awake    Reviewed: Allergy & Precautions, NPO status , Patient's Chart, lab work & pertinent test results  Airway Mallampati: II  TM Distance: >3 FB     Dental  (+) Teeth Intact, Implants,    Pulmonary asthma , Current Smoker,    breath sounds clear to auscultation       Cardiovascular hypertension, Pt. on medications  Rhythm:Regular Rate:Normal     Neuro/Psych    GI/Hepatic GERD  Controlled and Medicated,  Endo/Other  diabetes, Type 2, Oral Hypoglycemic Agents  Renal/GU      Musculoskeletal   Abdominal   Peds  Hematology   Anesthesia Other Findings   Reproductive/Obstetrics                             Anesthesia Physical Anesthesia Plan  ASA: III  Anesthesia Plan: General   Post-op Pain Management:    Induction: Intravenous  PONV Risk Score and Plan:   Airway Management Planned: Oral ETT  Additional Equipment:   Intra-op Plan:   Post-operative Plan: Extubation in OR  Informed Consent: I have reviewed the patients History and Physical, chart, labs and discussed the procedure including the risks, benefits and alternatives for the proposed anesthesia with the patient or authorized representative who has indicated his/her understanding and acceptance.     Plan Discussed with:   Anesthesia Plan Comments:         Anesthesia Quick Evaluation

## 2017-03-19 NOTE — Brief Op Note (Signed)
03/19/2017  3:57 PM  PATIENT:  Grace Bushy  67 y.o. male  PRE-OPERATIVE DIAGNOSIS:  bimalleolar fracture left ankle  POST-OPERATIVE DIAGNOSIS:  bimalleolar fracture left ankle  PROCEDURE:  Procedure(s): OPEN REDUCTION INTERNAL FIXATION (ORIF) LEFT ANKLE FRACTURE (Left) 74163  SURGEON:  Surgeon(s) and Role:    Carole Civil, MD - Primary  Operative findings bimalleolar fracture Weber C syndesmosis rupture up to the level of the fracture  Closed fracture  Implant Stryker ankle solutions 10 hole plate laterally 2 K wires laterally two syndesmosis screws 1 4 cortex 1 3 cortex 3 medial K wires  Assisted by [place] anesthesia  No blood was administered  No drains were placed  Local anesthetic 60 cc of Marcaine with epinephrine  Sponge and needle counts were correct  There were no specimens  It was a clean case   TOURNIQUET:   Total Tourniquet Time Documented: Thigh (Left) - 112 minutes Total: Thigh (Left) - 112 minutes   DICTATION: .Viviann Spare Dictation  PLAN OF CARE: Discharge to home after PACU  PATIENT DISPOSITION:  PACU - hemodynamically stable.   Delay start of Pharmacological VTE agent (>24hrs) due to surgical blood loss or risk of bleeding: not applicable  [id number]

## 2017-03-19 NOTE — H&P (Signed)
HISTORY AND PHYSICAL   Chief Complaint  Patient presents with  . Ankle Pain      fell off ladder      67 year old male works at [employer] improvement injured his left ankle while trying to get his cat out of a tree.  He lost his balance on a ladder fell on down to his left leg presented to the ER with pain and deformity.  X-rays show bimalleolar displaced left ankle fracture   He complains of mild to moderate dull aching lateral and medial left ankle pain times 5 days with a date of injury recorded as March 13, 2016 place of injury home mechanism fall   He is diabetic has hypertension and is a smoker       Review of Systems  Constitutional: Negative for chills, fever and weight loss.  Respiratory: Negative for shortness of breath.   Neurological: Negative for tingling and sensory change.            Past Medical History:  Diagnosis Date  . Arthritis    . Asthma    . Chronic kidney disease    . Diabetes mellitus    . GERD (gastroesophageal reflux disease)    . Hyperlipidemia    . Hypertension    . Palpitations             Past Surgical History:  Procedure Laterality Date  . COLONOSCOPY   02/15/2011    Procedure: COLONOSCOPY;  Surgeon: Rogene Houston, MD;  [facility]: [facility];  Service: Endoscopy;  Laterality: N/A;  . CYSTOSCOPY   2011  . ESOPHAGEAL DILATION N/A 07/08/2014    Procedure: ESOPHAGEAL DILATION;  Surgeon: Rogene Houston, MD;  Location: [facility];  Service: Endoscopy;  Laterality: N/A;  . ESOPHAGOGASTRODUODENOSCOPY N/A 07/08/2014    Procedure: ESOPHAGOGASTRODUODENOSCOPY (EGD);  Surgeon: Rogene Houston, MD;  Location: [facility];  Service: Endoscopy;  Laterality: N/A;  245  . TONSILLECTOMY        age 75  . TONSILLECTOMY               Family History  Problem Relation Age of Onset  . Heart attack Maternal Grandmother    . Stroke Maternal Grandfather      Social History         Tobacco Use  . Smoking status: Current Every Day Smoker    Packs/day: 1.00      Years: 25.00      Pack years: 25.00      Types: Cigarettes  . Smokeless tobacco: Never Used  Substance Use Topics  . Alcohol use: Yes      Alcohol/week: 7.2 oz      Types: 12 Cans of beer per week  . Drug use: No          Active Medications      Current Meds  Medication Sig  . aspirin EC 81 MG tablet Take 81 mg by mouth daily.    Marland Kitchen atorvastatin (LIPITOR) 20 MG tablet Take 20 mg by mouth every morning.    . Cholecalciferol (VITAMIN D3) 50000 units CAPS TAKE ONE CAPSULE BY MOUTH A WEEK  . DEXILANT 60 MG capsule TAKE 1 CAPSULE (60 MG TOTAL) BY MOUTH DAILY.  Marland Kitchen diltiazem (CARTIA XT) 300 MG 24 hr capsule Take 300 mg by mouth daily.    . fluticasone (FLONASE) 50 MCG/ACT nasal spray USE 2 SPRAYS EACH NOSTRILS DAILY  . lisinopril-hydrochlorothiazide (PRINZIDE,ZESTORETIC) 20-25 MG per tablet Take 1 tablet  by mouth daily.   . metFORMIN (GLUCOPHAGE) 1000 MG tablet Take 1,000 mg by mouth 2 (two) times daily with a meal.    . [DISCONTINUED] HYDROcodone-acetaminophen (NORCO/VICODIN) 5-325 MG tablet Take 1 tablet by mouth every 6 (six) hours as needed for severe pain.        BP (!) 146/96   Pulse 71   Ht 5\' 10"  (1.778 m)   Wt 228 lb (103.4 kg)   BMI 32.71 kg/m    Physical Exam  Constitutional: He is oriented to person, place, and time. He appears well-developed and well-nourished.  Vital signs have been reviewed and are stable. Gen. appearance the patient is well-developed and well-nourished with normal grooming and hygiene.   Musculoskeletal:       Right upper arm: Normal.       Left upper arm: Normal.       Right lower leg: Normal.  Gait is abnormal he cannot weight-bear on his left leg and is on crutches and in a wheelchair  Neurological: He is alert and oriented to person, place, and time.  Skin: Skin is warm and dry. No erythema.  Psychiatric: He has a normal mood and affect.  Vitals reviewed.     Left Ankle Exam    Tenderness  The patient is  experiencing tenderness in the deltoid, lateral malleolus and medial malleolus.  Swelling: moderate   Range of Motion  Dorsiflexion: abnormal  Plantar flexion: abnormal  Eversion: abnormal  Inversion: abnormal    Muscle Strength  Left ankle normal muscle strength: All areas are weak but primarily secondary to pain.   Tests  Anterior drawer: negative Varus tilt: negative   Other  Erythema: absent Scars: absent Sensation: normal Pulse: present   Comments:  Abrasion near the medial side of the ankle               Meds ordered this encounter  Medications  . HYDROcodone-acetaminophen (NORCO) 7.5-325 MG tablet      Sig: Take 1 tablet by mouth every 6 (six) hours as needed for moderate pain.      Dispense:  42 tablet      Refill:  0          Encounter Diagnosis  Name Primary?  . Closed bimalleolar fracture of left ankle, initial encounter Yes        PLAN:    Plan is to do an open reduction internal fixation of the left ankle with syndesmosis screw   The procedure has been fully reviewed with the patient; The risks and benefits of surgery have been discussed and explained and understood. Alternative treatment has also been reviewed, questions were encouraged and answered. The postoperative plan is also been reviewed.   I reviewed with the patient that he has 2 major risk factors for increased complications one is diabetic and to he smokes.  We discussed that he would have a minimal 12 weeks of nonweightbearing   At the 12-week point we can discuss possible knee walker and return to work

## 2017-03-19 NOTE — Anesthesia Postprocedure Evaluation (Signed)
Anesthesia Post Note  Patient: Reginald Stephens  Procedure(s) Performed: OPEN REDUCTION INTERNAL FIXATION (ORIF) LEFT ANKLE FRACTURE (Left Ankle)  Patient location during evaluation: PACU Anesthesia Type: General Level of consciousness: awake and alert and patient cooperative Pain management: pain level controlled Vital Signs Assessment: post-procedure vital signs reviewed and stable Respiratory status: spontaneous breathing, nonlabored ventilation and respiratory function stable Cardiovascular status: blood pressure returned to baseline Postop Assessment: no apparent nausea or vomiting Anesthetic complications: no     Last Vitals:  Vitals:   03/19/17 1605 03/19/17 1615  BP: (!) 143/67 (!) 149/71  Pulse:    Resp: 16 (!) 21  Temp: 37.1 C   SpO2: 96% 94%    Last Pain:  Vitals:   03/19/17 1615  TempSrc:   PainSc: 5                  Eain Mullendore J

## 2017-03-19 NOTE — Anesthesia Procedure Notes (Signed)
Procedure Name: Intubation Date/Time: 03/19/2017 1:41 PM Performed by: Charmaine Downs, CRNA Pre-anesthesia Checklist: Patient identified, Emergency Drugs available, Suction available and Patient being monitored Patient Re-evaluated:Patient Re-evaluated prior to induction Oxygen Delivery Method: Circle system utilized Preoxygenation: Pre-oxygenation with 100% oxygen Induction Type: IV induction Ventilation: Mask ventilation without difficulty and Oral airway inserted - appropriate to patient size Laryngoscope Size: Mac and 4 Grade View: Grade II Tube type: Oral Tube size: 8.0 mm Number of attempts: 1 Airway Equipment and Method: Stylet Placement Confirmation: ETT inserted through vocal cords under direct vision,  positive ETCO2 and breath sounds checked- equal and bilateral Secured at: 22 cm Tube secured with: Tape Dental Injury: Teeth and Oropharynx as per pre-operative assessment

## 2017-03-19 NOTE — Op Note (Signed)
03/19/2017  3:57 PM  PATIENT:  Reginald Stephens  67 y.o. male  PRE-OPERATIVE DIAGNOSIS:  bimalleolar fracture left ankle  POST-OPERATIVE DIAGNOSIS:  bimalleolar fracture left ankle  PROCEDURE:  Procedure(s): OPEN REDUCTION INTERNAL FIXATION (ORIF) LEFT ANKLE FRACTURE (Left) 84696   Surgery was done as follows.  The patient was identified in the preop area surgical site was confirmed chart review was completed surgical site was marked  Patient was taken to the operating room for general anesthesia.  The patient was in the supine position with 2 sandbags placed under the left hip  After sterile prep and drape timeout was completed  The limb was exsanguinated with a 6 inch Esmarch tourniquet was elevated to 300 mmHg  A lateral incision was made over the fibula carried down to bone the peroneal sheath fascia was opened and the bone was subperiosteally dissected until the fracture fragments could be identified.  The fracture fragments were irrigated.  Several attempts at reduction were performed unsuccessfully  I then took K wires and pin the comminuted fragments back together and then I was able to reduce the fracture with assistance from anesthesia to give full relaxation  The fibula was drilled reduced anatomically  However the syndesmosis rupture was still obvious  I then turned my attention to the medial side and made a curvilinear incision posterior to the fibula and inferior to the medial malleolus I divided the subcutaneous tissue full-thickness flaps were established and the fracture was irrigated and debrided the ankle joint was irrigated.  A pointed reduction clamp was used to reduce the medial malleolus.  I attempted one screw but it was not successful as the fragment was too fracture to small.  I therefore placed 3 K wires which reduced the ankle mortise but the syndesmosis was still unstable by the cotton test and it was examined under fluoroscopy as well.  I then took 2 of  the screws out from the plate first the distal 1 and placed a 60 mm screw across the syndesmosis reducing it followed by a second screw engaging 3 cortices.  The first screw engaged 4 cortices.  The ankle mortise was reduced.  Final films were taken after the K wires were cut and buried under the skin  The 2 K wires laterally were left in.  They could not be removed without the displacing the fracture fragments  Wounds were irrigated thoroughly.  The lateral wound closed with 0 Monocryl and staples the medial wound was closed with 3-0 nylon sutures in interrupted fashion  I injected 60 cc of Marcaine with epinephrine throughout the soft tissue around the ankle joint and then applied sterile dressings and a sugar tong splint after the tourniquet was released  The patient was extubated and taken to recovery room in stable condition with the postop plan nonweightbearing for 12 weeks x-rays at 2, 6 and 12 weeks.  SURGEON:  Surgeon(s) and Role:    * Carole Civil, MD - Primary  Operative findings bimalleolar fracture Weber C syndesmosis rupture up to the level of the fracture  Closed fracture  Implant Stryker ankle solutions 10 hole plate laterally 2 K wires laterally two syndesmosis screws 1 4 cortex 1 3 cortex 3 medial K wires  Assisted by [place] anesthesia  No blood was administered  No drains were placed  Local anesthetic 60 cc of Marcaine with epinephrine  Sponge and needle counts were correct  There were no specimens  It was a clean case  TOURNIQUET:   Total Tourniquet Time Documented: Thigh (Left) - 112 minutes Total: Thigh (Left) - 112 minutes   DICTATION: .Viviann Spare Dictation  PLAN OF CARE: Discharge to home after PACU  PATIENT DISPOSITION:  PACU - hemodynamically stable.   Delay start of Pharmacological VTE agent (>24hrs) due to surgical blood loss or risk of bleeding: not applicable  [id number]

## 2017-03-19 NOTE — Transfer of Care (Signed)
Immediate Anesthesia Transfer of Care Note  Patient: Marcelle Bebout [facility]  Procedure(s) Performed: OPEN REDUCTION INTERNAL FIXATION (ORIF) LEFT ANKLE FRACTURE (Left Ankle)  Patient Location: PACU  Anesthesia Type:General  Level of Consciousness: awake and patient cooperative  Airway & Oxygen Therapy: Patient Spontanous Breathing and Patient connected to face mask oxygen  Post-op Assessment: Report given to RN, Post -op Vital signs reviewed and stable and Patient moving all extremities  Post vital signs: Reviewed and stable  Last Vitals:  Vitals:   03/19/17 1320 03/19/17 1325  BP: 111/69 (!) 106/58  Pulse:    Resp: (!) 23 (!) 21  Temp:    SpO2: 95% 98%    Last Pain:  Vitals:   03/19/17 1125  TempSrc: Oral  PainSc: 5       Patients Stated Pain Goal: 7 (09/24/55 5051)  Complications: No apparent anesthesia complications

## 2017-03-20 ENCOUNTER — Telehealth: Payer: Self-pay | Admitting: Orthopedic Surgery

## 2017-03-20 DIAGNOSIS — S82842D Displaced bimalleolar fracture of left lower leg, subsequent encounter for closed fracture with routine healing: Secondary | ICD-10-CM

## 2017-03-20 NOTE — Telephone Encounter (Signed)
Patient wants to know if he can get a hands-free crutch I told him insurance may not cover, he states he will pay for it, is it okay to send order for him to [employer]?

## 2017-03-20 NOTE — Telephone Encounter (Signed)
yes

## 2017-03-20 NOTE — Telephone Encounter (Signed)
Patient is asking if someone would give him a call. He had surgery yesterday (03/19/17) on his ankle. He doesn't like the crutches and has some questions about something else he may use.  Please call and advise.

## 2017-03-21 ENCOUNTER — Telehealth: Payer: Self-pay | Admitting: Orthopedic Surgery

## 2017-03-21 ENCOUNTER — Encounter (HOSPITAL_COMMUNITY): Payer: Self-pay | Admitting: Orthopedic Surgery

## 2017-03-21 NOTE — Telephone Encounter (Signed)
Faxed

## 2017-03-21 NOTE — Telephone Encounter (Signed)
Please call Doris from [facility] regarding hands free crutch.  Phone number is [phone]  Thanks

## 2017-03-21 NOTE — Telephone Encounter (Addendum)
I called her back they do not carry the device patient has requested. They have told him

## 2017-03-25 DIAGNOSIS — Z8781 Personal history of (healed) traumatic fracture: Secondary | ICD-10-CM | POA: Insufficient documentation

## 2017-03-25 DIAGNOSIS — Z9889 Other specified postprocedural states: Secondary | ICD-10-CM | POA: Insufficient documentation

## 2017-03-26 ENCOUNTER — Encounter: Payer: Self-pay | Admitting: Orthopedic Surgery

## 2017-03-26 ENCOUNTER — Ambulatory Visit (INDEPENDENT_AMBULATORY_CARE_PROVIDER_SITE_OTHER): Payer: BLUE CROSS/BLUE SHIELD | Admitting: Orthopedic Surgery

## 2017-03-26 VITALS — BP 132/78 | HR 70 | Ht 70.0 in | Wt 228.0 lb

## 2017-03-26 DIAGNOSIS — Z967 Presence of other bone and tendon implants: Secondary | ICD-10-CM

## 2017-03-26 DIAGNOSIS — Z9889 Other specified postprocedural states: Secondary | ICD-10-CM

## 2017-03-26 DIAGNOSIS — S82842D Displaced bimalleolar fracture of left lower leg, subsequent encounter for closed fracture with routine healing: Secondary | ICD-10-CM

## 2017-03-26 DIAGNOSIS — Z4889 Encounter for other specified surgical aftercare: Secondary | ICD-10-CM | POA: Diagnosis not present

## 2017-03-26 DIAGNOSIS — Z8781 Personal history of (healed) traumatic fracture: Secondary | ICD-10-CM

## 2017-03-26 NOTE — Progress Notes (Signed)
POST OP APPT  Chief Complaint  Patient presents with  . Post-op Follow-up    left ankle ORIF 03/19/17    BP 132/78   Pulse 70   Ht 5\' 10"  (1.778 m)   Wt 228 lb (103.4 kg)   BMI 32.71 kg/m    Current Outpatient Medications:  .  aspirin EC 81 MG tablet, Take 81 mg by mouth daily.  , Disp: , Rfl:  .  atorvastatin (LIPITOR) 20 MG tablet, Take 20 mg by mouth every morning.  , Disp: , Rfl:  .  Cholecalciferol (VITAMIN D3) 50000 units CAPS, TAKE ONE CAPSULE BY MOUTH A WEEK, Disp: , Rfl: 11 .  DEXILANT 60 MG capsule, TAKE 1 CAPSULE (60 MG TOTAL) BY MOUTH DAILY., Disp: 90 capsule, Rfl: 3 .  diltiazem (CARTIA XT) 300 MG 24 hr capsule, Take 300 mg by mouth daily.  , Disp: , Rfl:  .  fluticasone (FLONASE) 50 MCG/ACT nasal spray, USE 2 SPRAYS EACH NOSTRILS DAILY, Disp: , Rfl: 1 .  HYDROcodone-acetaminophen (NORCO) 7.5-325 MG tablet, Take 1 tablet by mouth every 6 (six) hours as needed for moderate pain., Disp: 42 tablet, Rfl: 0 .  lisinopril-hydrochlorothiazide (PRINZIDE,ZESTORETIC) 20-25 MG per tablet, Take 1 tablet by mouth daily. , Disp: , Rfl:  .  metFORMIN (GLUCOPHAGE) 1000 MG tablet, Take 1,000 mg by mouth 2 (two) times daily with a meal.  , Disp: , Rfl:   C/O THROBBING WHEN LEG IN DEPENDENT POSITION  PE/  MILD TO MODERATE SWELLING  HIS WOUNDS HAVE SANGUINOUS DRAINAGE NO BLISTERS   NEURO SENSATION NORMAL   RESPLINTED   FU XRAYS AND SUTURE STAPLE REMOVAL NEXT WEEK   Encounter Diagnoses  Name Primary?  . S/P ORIF (open reduction internal fixation) fracture left ankle 03/19/17   . Closed bimalleolar fracture of left ankle with routine healing, subsequent encounter   . Aftercare following surgery Yes

## 2017-03-26 NOTE — Patient Instructions (Signed)
Keep foot elevated

## 2017-03-29 ENCOUNTER — Encounter: Payer: Self-pay | Admitting: Orthopedic Surgery

## 2017-04-01 ENCOUNTER — Ambulatory Visit (INDEPENDENT_AMBULATORY_CARE_PROVIDER_SITE_OTHER): Payer: Self-pay | Admitting: Orthopedic Surgery

## 2017-04-01 ENCOUNTER — Ambulatory Visit (INDEPENDENT_AMBULATORY_CARE_PROVIDER_SITE_OTHER): Payer: BLUE CROSS/BLUE SHIELD

## 2017-04-01 ENCOUNTER — Encounter: Payer: Self-pay | Admitting: Orthopedic Surgery

## 2017-04-01 VITALS — BP 132/77 | HR 81 | Ht 70.0 in

## 2017-04-01 DIAGNOSIS — Z9889 Other specified postprocedural states: Secondary | ICD-10-CM

## 2017-04-01 DIAGNOSIS — Z967 Presence of other bone and tendon implants: Secondary | ICD-10-CM | POA: Diagnosis not present

## 2017-04-01 DIAGNOSIS — Z8781 Personal history of (healed) traumatic fracture: Secondary | ICD-10-CM

## 2017-04-01 DIAGNOSIS — S82842D Displaced bimalleolar fracture of left lower leg, subsequent encounter for closed fracture with routine healing: Secondary | ICD-10-CM | POA: Diagnosis not present

## 2017-04-01 MED ORDER — HYDROCODONE-ACETAMINOPHEN 7.5-325 MG PO TABS: 1.0000 | ORAL_TABLET | Freq: Four times a day (QID) | ORAL | 0 refills | Status: DC | PRN

## 2017-04-01 NOTE — Addendum Note (Signed)
Addended byCandice Camp on: 04/01/2017 12:12 PM   Modules accepted: Orders

## 2017-04-01 NOTE — Progress Notes (Signed)
Fracture care follow-up  Chief Complaint  Patient presents with  . Post-op Follow-up    right ankle 03/19/17   13 days postop   Encounter Diagnoses  Name Primary?  . S/P ORIF (open reduction internal fixation) fracture left ankle 03/19/17 Yes  . Closed bimalleolar fracture of left ankle with routine healing, subsequent encounter      Current Outpatient Medications:  .  aspirin EC 81 MG tablet, Take 81 mg by mouth daily.  , Disp: , Rfl:  .  atorvastatin (LIPITOR) 20 MG tablet, Take 20 mg by mouth every morning.  , Disp: , Rfl:  .  Cholecalciferol (VITAMIN D3) 50000 units CAPS, TAKE ONE CAPSULE BY MOUTH A WEEK, Disp: , Rfl: 11 .  DEXILANT 60 MG capsule, TAKE 1 CAPSULE (60 MG TOTAL) BY MOUTH DAILY., Disp: 90 capsule, Rfl: 3 .  diltiazem (CARTIA XT) 300 MG 24 hr capsule, Take 300 mg by mouth daily.  , Disp: , Rfl:  .  fluticasone (FLONASE) 50 MCG/ACT nasal spray, USE 2 SPRAYS EACH NOSTRILS DAILY, Disp: , Rfl: 1 .  HYDROcodone-acetaminophen (NORCO) 7.5-325 MG tablet, Take 1 tablet by mouth every 6 (six) hours as needed for moderate pain., Disp: 42 tablet, Rfl: 0 .  lisinopril-hydrochlorothiazide (PRINZIDE,ZESTORETIC) 20-25 MG per tablet, Take 1 tablet by mouth daily. , Disp: , Rfl:  .  metFORMIN (GLUCOPHAGE) 1000 MG tablet, Take 1,000 mg by mouth 2 (two) times daily with a meal.  , Disp: , Rfl:   BP 132/77   Pulse 81   Ht 5\' 10"  (1.778 m)   BMI 32.71 kg/m   Physical Exam  Staples were extracted wounds look clean dry and intact mild swelling foot  No signs of infection   Xrays: X-rays show ankle mortise reduction hardware is intact   Cam walker no weightbearing follow-up 4 weeks x-ray

## 2017-04-02 ENCOUNTER — Encounter: Payer: Self-pay | Admitting: Orthopedic Surgery

## 2017-04-02 ENCOUNTER — Telehealth: Payer: Self-pay | Admitting: Radiology

## 2017-04-02 ENCOUNTER — Telehealth (HOSPITAL_COMMUNITY): Payer: Self-pay | Admitting: Physician Assistant

## 2017-04-02 NOTE — Telephone Encounter (Signed)
Opened in error

## 2017-04-02 NOTE — Telephone Encounter (Signed)
04/02/17  pt rescheduled because his wife will be substituting and he won't be able to come.... he rescheduled for next week

## 2017-04-03 ENCOUNTER — Ambulatory Visit (HOSPITAL_COMMUNITY): Payer: BLUE CROSS/BLUE SHIELD

## 2017-04-08 ENCOUNTER — Encounter: Payer: Self-pay | Admitting: Orthopedic Surgery

## 2017-04-10 ENCOUNTER — Telehealth: Payer: Self-pay | Admitting: Orthopedic Surgery

## 2017-04-10 ENCOUNTER — Encounter (HOSPITAL_COMMUNITY): Payer: Self-pay

## 2017-04-10 ENCOUNTER — Other Ambulatory Visit: Payer: Self-pay

## 2017-04-10 ENCOUNTER — Ambulatory Visit (INDEPENDENT_AMBULATORY_CARE_PROVIDER_SITE_OTHER): Payer: BLUE CROSS/BLUE SHIELD | Admitting: Orthopedic Surgery

## 2017-04-10 ENCOUNTER — Other Ambulatory Visit: Payer: Self-pay | Admitting: Orthopedic Surgery

## 2017-04-10 ENCOUNTER — Encounter: Payer: Self-pay | Admitting: Orthopedic Surgery

## 2017-04-10 ENCOUNTER — Ambulatory Visit (HOSPITAL_COMMUNITY): Payer: BLUE CROSS/BLUE SHIELD | Attending: Physician Assistant

## 2017-04-10 VITALS — BP 151/101 | HR 89 | Resp 16

## 2017-04-10 DIAGNOSIS — R600 Localized edema: Secondary | ICD-10-CM | POA: Diagnosis not present

## 2017-04-10 DIAGNOSIS — Z9889 Other specified postprocedural states: Secondary | ICD-10-CM

## 2017-04-10 DIAGNOSIS — M25572 Pain in left ankle and joints of left foot: Secondary | ICD-10-CM

## 2017-04-10 DIAGNOSIS — S82842D Displaced bimalleolar fracture of left lower leg, subsequent encounter for closed fracture with routine healing: Secondary | ICD-10-CM

## 2017-04-10 DIAGNOSIS — Z967 Presence of other bone and tendon implants: Secondary | ICD-10-CM

## 2017-04-10 DIAGNOSIS — R2689 Other abnormalities of gait and mobility: Secondary | ICD-10-CM | POA: Diagnosis not present

## 2017-04-10 DIAGNOSIS — Z8781 Personal history of (healed) traumatic fracture: Secondary | ICD-10-CM

## 2017-04-10 MED ORDER — HYDROCODONE-ACETAMINOPHEN 7.5-325 MG PO TABS: 1.0000 | ORAL_TABLET | Freq: Four times a day (QID) | ORAL | 0 refills | Status: DC | PRN

## 2017-04-10 NOTE — Therapy (Signed)
[facility] [place], [place], [address] Phone: [phone]   Fax:  [phone]  Physical Therapy Evaluation  Patient Details  Name: Reginald Stephens MRN: [id number] Date of Birth: [birth year] Referring Provider: Arther Abbott MD   Encounter Date: 04/10/2017  PT End of Session - 04/10/17 1151    Visit Number  1    Number of Visits  1    Authorization Type  [employer]    Authorization Time Period  04/10/17-04/10/17    Authorization - Visit Number  1    Authorization - Number of Visits  1    PT Start Time  0950    PT Stop Time  1040    PT Time Calculation (min)  50 min    Equipment Utilized During Treatment  Other (comment) crutches, hands free crutch    Activity Tolerance  Patient tolerated treatment well    Behavior During Therapy  [facility] for tasks assessed/performed       Past Medical History:  Diagnosis Date  . Arthritis   . Asthma   . Chronic kidney disease   . Diabetes mellitus   . GERD (gastroesophageal reflux disease)   . History of kidney stones   . Hyperlipidemia   . Hypertension   . Palpitations     Past Surgical History:  Procedure Laterality Date  . COLONOSCOPY  02/15/2011   Procedure: COLONOSCOPY;  Surgeon: Rogene Houston, MD;  [facility];  Service: Endoscopy;  Laterality: N/A;  . CYSTOSCOPY  2011  . ESOPHAGEAL DILATION N/A 07/08/2014   Procedure: ESOPHAGEAL DILATION;  Surgeon: Rogene Houston, MD;  Location[facility];  Service: Endoscopy;  Laterality: N/A;  . ESOPHAGOGASTRODUODENOSCOPY N/A 07/08/2014   Procedure: ESOPHAGOGASTRODUODENOSCOPY (EGD);  Surgeon: Rogene Houston, MD;  [facility];  Service: Endoscopy;  Laterality: N/A;  245  . ORIF ANKLE FRACTURE Left 03/19/2017   Procedure: OPEN REDUCTION INTERNAL FIXATION (ORIF) LEFT ANKLE FRACTURE;  Surgeon: Carole Civil, MD;  Location: [facility];  Service: Orthopedics;  Laterality: Left;  . TONSILLECTOMY     age  67  . TONSILLECTOMY      There were no vitals filed for this visit.   Subjective Assessment - 04/10/17 0956    Subjective  Patient reports he fell off a ladder from about 6 feet high when he was trying to get his out of a tree and that it had climbed up, he reports he lost his footing and fell while reaching forward to grab the cat. He states that he had surgery 03/19/17 and has been told to remain NWB on his Lt LE. He was put in an air-cast to maintain stability for the surgery to heal. He reports he has been having drainage from his incision and the MD office instructed him that he can come it to have it re-wrapped if needed. He states his daughter is finishing nursing school and put a clean ace wrap on his ankle today but that there is already new drainage. His follow up is not until 05/02/17. He states overall his pain has not been severe but that he leg swells while waering the air-cast and this is the most uncomfortable. He states he has been told he is allowed to remove the aircast at night.    Patient is accompained by:  Family member son did not come back to session    Pertinent History  Patient fell off 14ft ladder, has  a kitten that climbed up a tree and he climbed up to the tree to he kept trying to reach for her and lost his footing and     Limitations  Standing;Walking;House hold activities;Sitting    How long can you sit comfortably?  unlimited    How long can you stand comfortably?  about 5 minutes    How long can you walk comfortably?  walking with bil axillary crutch for a few minutes    Patient Stated Goals  walk again without crutches    Currently in Pain?  Yes    Pain Score  4     Pain Location  Ankle    Pain Orientation  Left    Pain Descriptors / Indicators  Throbbing;Dull;Aching    Pain Type  Surgical pain    Pain Radiating Towards  throbbing up into tibia    Pain Onset  1 to 4 weeks ago    Pain Frequency  Intermittent intensity changes    Aggravating Factors   standing  up inititally,     Pain Relieving Factors  elevating, medicine    Effect of Pain on Daily Activities  moderate limitation with walking and bathing    Multiple Pain Sites  No        [facility] PT Assessment - 04/10/17 0001      Assessment   Medical Diagnosis  Left Ankle ORIF (bimalleolar fracture)    Referring Provider  Arther Abbott MD    Onset Date/Surgical Date  03/19/17    Next MD Visit  05/02/17    Prior Therapy  none      Precautions   Precaution Comments  Patient is in aircast on [place] with NWB precautions      Restrictions   Weight Bearing Restrictions  Yes    LLE Weight Bearing  Non weight bearing      Balance Screen   Has the patient fallen in the past 6 months  Yes    How many times?  1 fall from ladder resulting in broken left ankle, no others    Has the patient had a decrease in activity level because of a fear of falling?   Yes    Is the patient reluctant to leave their home because of a fear of falling?   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 form work      Associate Professor   Overall Cognitive Status  Within Functional Limits for tasks assessed      Observation/Other Assessments   Observations  Wound dressing and ace wrap has sangionous drainage that is moist and new around medial mallelous incision, did not remove wound bandage to assess incision    Focus on Therapeutic Outcomes (FOTO)  not  completed 1 time visit      Observation/Other Assessments-Edema    Edema  Circumferential;Figure 8      Circumferential Edema   Circumferential - Right  11.75 inches around ankle malleoli    Circumferential - Left   13.75 inches around ankle malleoli      Figure 8 Edema   Figure 8 - Right   23 inches ankle    Figure 8 - Left   26 inches ankle      ROM / Strength   AROM / PROM / Strength  Strength      Strength   Right Hip Flexion  5/5    Right Hip Extension  4-/5    Right Hip ABduction  4+/5    Left Hip Flexion  5/5    Left Hip Extension  4-/5    Left Hip ABduction  4+/5    Right/Left Knee  Right;Left    Right Knee Flexion  5/5    Right Knee Extension  5/5    Left Knee Flexion  5/5    Left Knee Extension  5/5    Right Ankle Dorsiflexion  4+/5      Flexibility   Soft Tissue Assessment /Muscle Length  no      Ambulation/Gait   Ambulation/Gait  Yes    Ambulation/Gait Assistance  6: Modified independent (Device/Increase time)    Ambulation Distance (Feet)  35 Feet    Assistive device  L Axillary Crutch;R Axillary Crutch;Crutches    Gait Pattern  Step-to pattern;Trunk flexed    Ambulation Surface  Level      Objective measurements completed on examination: See above findings.    [facility] Adult PT Treatment/Exercise - 04/10/17 0001      Ambulation/Gait   Pre-Gait Activities  Standing balance with "hands free crutch"; patient unable to maintain balance without external support of Bil Axillary crutches    Gait Comments  Gait trianing perforemd with "hands free crutch for LLE" patient performed 5 forward/backward steps while also using axillary crutches, patient had 1 x LOB and required external support to prevent fall. He is currently unsafe with hands free crutch.        PT Education - 04/10/17 1142    Education provided  Yes    Education Details  Discussed and educated patient on safety concerns with "hands free crutch". Patient agreed that he is not safe enough with  this to use it and that he is safer with the axillary crutches. I discussed that since he is NWB and his referral specified 1-2 sessions to train with "hands free crutch" that we will discharge PT after this session, he was agreeable. Educated patient to discuss rehab for ankle mobility after MD feels he has healed and if he has difficulty with functional mobility. I educated patient that due to his medical history of Diabetes he will have a slightly longer healing time and that it is important to maintain his Doctor's precautions and WB orders. I educated him that his wound on the medial left ankle is has some serosanguinous drainage present and that some of the yellow coloring may be from the wound dressing (there appeared to be xeroform on top of the wound). I informed him that it looks slightly macerated around the wound that the dressing has slid away form and encouraged him to go back to the surgeon's office to have it re-dressed and wrapped. He was agreeable to this and stated he planned to go after our session.  Person(s) Educated  Patient    Methods  Explanation    Comprehension  Verbalized understanding       PT Short Term Goals - 04/10/17 1156      PT SHORT TERM GOAL #1   Title  Patient verbalize understanding of appropriate and safe crutch use to ambulate on level/unlevel surfaces using bilateral axillary crutches.     Time  1    Period  Days    Status  Achieved    Target Date  04/10/17        Plan - 04/10/17 1157    Clinical Impression Statement  Mr. Kon presents for initial PT evaluation and treatment following left ankle ORIF secondary to bimalleolar fraction on 03/19/17. He was referred for gait training with "hands free crutch" for left LE. His strength is within functional limits and weakness was noted for hip extensors bilaterally. He currently requires external UE support to maintain balance while NWB on Lt LE. Due to this, he is an inappropriate candidate to ambulate with  hand free crutch. He has demonstrated good understanding of safety concerns with hands free crutch and demonstrate appropriate swing through gait pattern with bilateral axillary crutches to maintain NWB status. He has been educated on ice and elevation techniques to manage left LE edema and was educated to return to MD office to have wound dressing re-applied as there is new serosangiounous drainage present. Patient was understanding of information/education provided and will be discharged following this session.    History and Personal Factors relevant to plan of care:  Patient fell from ladder (6 feet) and fractured his left ankle, no other falls    Clinical Presentation  Stable    Clinical Presentation due to:  pain, edema, decreased balance, mobility, and gait    Clinical Decision Making  Low    Rehab Potential  Good    PT Frequency  -- 1 time visit    PT Treatment/Interventions  ADLs/Self Care Home Management;Gait training;DME Instruction;Functional mobility training;Patient/family education    PT Next Visit Plan  Discharging from 1 time visit    Consulted and Agree with Plan of Care  Patient       Patient will benefit from skilled therapeutic intervention in order to improve the following deficits and impairments:  Abnormal gait, Decreased balance, Decreased skin integrity, Increased edema, Difficulty walking, Decreased mobility  Visit Diagnosis: Pain in left ankle and joints of left foot  Localized edema  Other abnormalities of gait and mobility     Problem List Patient Active Problem List   Diagnosis Date Noted  . S/P ORIF (open reduction internal fixation) fracture left ankle 03/19/17 03/25/2017  . Closed bimalleolar fracture of left ankle   . Palpitations 03/01/2015  . Essential hypertension 08/25/2012  . Hyperlipidemia 08/25/2012  . Diabetes ([place]) 08/25/2012  . Tobacco abuse 08/25/2012    Kipp Brood, PT, DPT Physical Therapist with [facility]  04/10/2017 12:13 PM    [facility] [address] [place], [place], [address] Phone: [phone]   Fax:  [phone]  Name: DIANNE BADY MRN: [id number] Date of Birth: 26-Jan-1951

## 2017-04-10 NOTE — Telephone Encounter (Signed)
done

## 2017-04-10 NOTE — Progress Notes (Signed)
Encounter Diagnoses  Name Primary?  . Closed bimalleolar fracture of left ankle with routine healing, subsequent encounter Yes  . S/P ORIF (open reduction internal fixation) fracture left ankle 03/19/17     Check wound  No problems with the wound that I can see he does have edema above the dressing recommend wrapping the foot from toes to knee continue with current treatment and follow-up

## 2017-04-10 NOTE — Telephone Encounter (Signed)
Patient called after today's office visit 04/10/17, relays that [place], [place], "has already put back the pain medication" (states he did not return their messages until today-ignored them as said [facility]) Requests for medication refill to be [facility] to [facility] [place] for: HYDROcodone-acetaminophen (NORCO) 7.5-325 MG tablet 42 tablet

## 2017-04-10 NOTE — Telephone Encounter (Signed)
Relayed to patient.

## 2017-04-10 NOTE — Telephone Encounter (Signed)
Done, per office visit today, 04/10/17.

## 2017-04-14 ENCOUNTER — Encounter: Payer: Self-pay | Admitting: Orthopedic Surgery

## 2017-04-15 ENCOUNTER — Encounter: Payer: Self-pay | Admitting: Orthopedic Surgery

## 2017-04-16 ENCOUNTER — Encounter: Payer: Self-pay | Admitting: Orthopaedic Surgery

## 2017-04-16 ENCOUNTER — Ambulatory Visit (INDEPENDENT_AMBULATORY_CARE_PROVIDER_SITE_OTHER): Payer: BLUE CROSS/BLUE SHIELD | Admitting: Orthopaedic Surgery

## 2017-04-16 DIAGNOSIS — Z967 Presence of other bone and tendon implants: Secondary | ICD-10-CM

## 2017-04-16 DIAGNOSIS — Z8781 Personal history of (healed) traumatic fracture: Secondary | ICD-10-CM

## 2017-04-16 DIAGNOSIS — Z4889 Encounter for other specified surgical aftercare: Secondary | ICD-10-CM

## 2017-04-16 DIAGNOSIS — Z9889 Other specified postprocedural states: Secondary | ICD-10-CM

## 2017-04-16 MED ORDER — DOXYCYCLINE HYCLATE 50 MG PO CAPS: 50.0000 mg | ORAL_CAPSULE | Freq: Two times a day (BID) | ORAL | 0 refills | Status: DC

## 2017-04-16 NOTE — Progress Notes (Signed)
He is here for wound check.  He is post surgery of the left ankle, bimalleolar.  He has an opening of the mid part of the wound just below the malleolus.  It is superficial.  He is concerned about it as it had some yellow spotting on a bandage.  NV intact.  The foot is not red or fluctuant.  Peroxide used, new dressing applied.  I will see him Thursday for re-check.  I just want to make sure no change.  Rx for doxycycline called in.  He is a diabetic.  Call if any change.  Electronically Signed Sanjuana Kava, MD 2/12/20199:53 AM

## 2017-04-18 ENCOUNTER — Encounter: Payer: Self-pay | Admitting: Orthopaedic Surgery

## 2017-04-18 ENCOUNTER — Ambulatory Visit (INDEPENDENT_AMBULATORY_CARE_PROVIDER_SITE_OTHER): Payer: BLUE CROSS/BLUE SHIELD | Admitting: Orthopaedic Surgery

## 2017-04-18 DIAGNOSIS — Z8781 Personal history of (healed) traumatic fracture: Secondary | ICD-10-CM

## 2017-04-18 DIAGNOSIS — S82892D Other fracture of left lower leg, subsequent encounter for closed fracture with routine healing: Secondary | ICD-10-CM | POA: Diagnosis not present

## 2017-04-18 DIAGNOSIS — I10 Essential (primary) hypertension: Secondary | ICD-10-CM | POA: Diagnosis not present

## 2017-04-18 DIAGNOSIS — Z1389 Encounter for screening for other disorder: Secondary | ICD-10-CM | POA: Diagnosis not present

## 2017-04-18 DIAGNOSIS — Z967 Presence of other bone and tendon implants: Secondary | ICD-10-CM

## 2017-04-18 DIAGNOSIS — Z6831 Body mass index (BMI) 31.0-31.9, adult: Secondary | ICD-10-CM | POA: Diagnosis not present

## 2017-04-18 DIAGNOSIS — E6609 Other obesity due to excess calories: Secondary | ICD-10-CM | POA: Diagnosis not present

## 2017-04-18 DIAGNOSIS — Z4889 Encounter for other specified surgical aftercare: Secondary | ICD-10-CM

## 2017-04-18 DIAGNOSIS — E1129 Type 2 diabetes mellitus with other diabetic kidney complication: Secondary | ICD-10-CM | POA: Diagnosis not present

## 2017-04-18 DIAGNOSIS — Z9889 Other specified postprocedural states: Secondary | ICD-10-CM

## 2017-04-18 NOTE — Progress Notes (Signed)
CC:  I feel fine.  I want to drive.  He is here for wound check of the left ankle.  He has been applying peroxide.  He has no redness.  He has no pain, he is doing well.  The wound is improved from two days ago.  He has a very superficial opening of the mid part of the left ankle medial wound.  NV intact.  A new dressing applied.  Continue the antibiotics.  Encounter Diagnoses  Name Primary?  . S/P ORIF (open reduction internal fixation) fracture left ankle 03/19/17 Yes  . Encounter for postoperative wound care    Keep regularly scheduled appointment with Dr. Aline Brochure.  Electronically Signed Sanjuana Kava, MD 2/14/20199:02 AM

## 2017-04-29 ENCOUNTER — Ambulatory Visit (INDEPENDENT_AMBULATORY_CARE_PROVIDER_SITE_OTHER): Payer: BLUE CROSS/BLUE SHIELD | Admitting: Orthopedic Surgery

## 2017-04-29 ENCOUNTER — Ambulatory Visit (INDEPENDENT_AMBULATORY_CARE_PROVIDER_SITE_OTHER): Payer: BLUE CROSS/BLUE SHIELD

## 2017-04-29 ENCOUNTER — Encounter: Payer: Self-pay | Admitting: Orthopedic Surgery

## 2017-04-29 VITALS — BP 127/72 | HR 72 | Ht 70.0 in | Wt 228.0 lb

## 2017-04-29 DIAGNOSIS — Z967 Presence of other bone and tendon implants: Secondary | ICD-10-CM

## 2017-04-29 DIAGNOSIS — Z8781 Personal history of (healed) traumatic fracture: Secondary | ICD-10-CM | POA: Diagnosis not present

## 2017-04-29 DIAGNOSIS — S82842D Displaced bimalleolar fracture of left lower leg, subsequent encounter for closed fracture with routine healing: Secondary | ICD-10-CM

## 2017-04-29 DIAGNOSIS — Z9889 Other specified postprocedural states: Secondary | ICD-10-CM

## 2017-04-29 MED ORDER — HYDROCODONE-ACETAMINOPHEN 5-325 MG PO TABS: 1.0000 | ORAL_TABLET | Freq: Four times a day (QID) | ORAL | 0 refills | Status: DC | PRN

## 2017-04-29 NOTE — Progress Notes (Signed)
Fracture care follow-up  Chief Complaint  Patient presents with  . Ankle Pain    Left  03/19/17    Encounter Diagnoses  Name Primary?  . S/P ORIF (open reduction internal fixation) fracture left ankle 03/19/17 Yes  . Closed bimalleolar fracture of left ankle with routine healing, subsequent encounter    6 weeks postop tomorrow  He was started on antibiotics for wound drainage on February 12 with a follow-up on the 14th with improvement.  He continues to have improvement complains of swelling of his foot which is expected   Current Outpatient Medications:  .  aspirin EC 81 MG tablet, Take 81 mg by mouth daily.  , Disp: , Rfl:  .  atorvastatin (LIPITOR) 20 MG tablet, Take 20 mg by mouth every morning.  , Disp: , Rfl:  .  Cholecalciferol (VITAMIN D3) 50000 units CAPS, TAKE ONE CAPSULE BY MOUTH A WEEK, Disp: , Rfl: 11 .  DEXILANT 60 MG capsule, TAKE 1 CAPSULE (60 MG TOTAL) BY MOUTH DAILY., Disp: 90 capsule, Rfl: 3 .  diltiazem (CARTIA XT) 300 MG 24 hr capsule, Take 300 mg by mouth daily.  , Disp: , Rfl:  .  fluticasone (FLONASE) 50 MCG/ACT nasal spray, USE 2 SPRAYS EACH NOSTRILS DAILY, Disp: , Rfl: 1 .  HYDROcodone-acetaminophen (NORCO) 7.5-325 MG tablet, Take 1 tablet by mouth every 6 (six) hours as needed for moderate pain., Disp: 20 tablet, Rfl: 0 .  lisinopril-hydrochlorothiazide (PRINZIDE,ZESTORETIC) 20-25 MG per tablet, Take 1 tablet by mouth daily. , Disp: , Rfl:  .  metFORMIN (GLUCOPHAGE) 1000 MG tablet, Take 1,000 mg by mouth 2 (two) times daily with a meal.  , Disp: , Rfl:  .  doxycycline (VIBRAMYCIN) 50 MG capsule, Take 1 capsule (50 mg total) by mouth 2 (two) times daily. (Patient not taking: Reported on 04/29/2017), Disp: 20 capsule, Rfl: 0  BP 127/72   Pulse 72   Ht 5\' 10"  (1.778 m)   Wt 228 lb (103.4 kg)   BMI 32.71 kg/m   Physical Exam   Xrays: Lateral plate multiple pins multiple medial pins 2 syndesmosis screws  X-rays show the fracture is healing appropriately  hardware is stable  Opioid databank checked last prescription was on April 10, 2018 hydrocodone 7.5 mg risk score is 300  Plan   Continue with protected weightbearing for 6 weeks repeat x-ray  Medication refilled   Meds ordered this encounter  Medications  . HYDROcodone-acetaminophen (NORCO/VICODIN) 5-325 MG tablet    Sig: Take 1 tablet by mouth every 6 (six) hours as needed for moderate pain.    Dispense:  28 tablet    Refill:  0   He will check with employer return to work 2 weeks light duty with boot and crutches

## 2017-04-29 NOTE — Patient Instructions (Signed)
Weightbearing with Cam walker and crutches   okay to drive

## 2017-04-30 ENCOUNTER — Telehealth: Payer: Self-pay | Admitting: Radiology

## 2017-04-30 ENCOUNTER — Encounter: Payer: Self-pay | Admitting: Orthopedic Surgery

## 2017-04-30 NOTE — Telephone Encounter (Signed)
Patient called he states [employer] will not let him return to work until he is out of the boot and off the crutches. He wants to know if you will see him back earlier than planned to d/c boot / crutches early  Please advise.

## 2017-04-30 NOTE — Telephone Encounter (Signed)
no

## 2017-05-02 ENCOUNTER — Encounter: Payer: Self-pay | Admitting: Orthopedic Surgery

## 2017-05-02 NOTE — Telephone Encounter (Signed)
Advised patient via my chart

## 2017-05-03 ENCOUNTER — Encounter: Payer: Self-pay | Admitting: Orthopedic Surgery

## 2017-05-07 ENCOUNTER — Telehealth: Payer: Self-pay | Admitting: Radiology

## 2017-05-07 ENCOUNTER — Encounter: Payer: Self-pay | Admitting: Orthopedic Surgery

## 2017-05-07 NOTE — Telephone Encounter (Signed)
correct 

## 2017-05-07 NOTE — Telephone Encounter (Signed)
Patient sent me a message about his ankle   He states Can  ankle get wet from bathing now walk only with crutches can walk in house a little with out them  I told him he can shower if he has a bench/ chair for the shower, but he can not walk without the crutches, and to remain NWB  To you FYI

## 2017-05-12 ENCOUNTER — Encounter: Payer: Self-pay | Admitting: Orthopedic Surgery

## 2017-05-13 ENCOUNTER — Encounter: Payer: Self-pay | Admitting: Orthopedic Surgery

## 2017-05-13 ENCOUNTER — Ambulatory Visit (INDEPENDENT_AMBULATORY_CARE_PROVIDER_SITE_OTHER): Payer: BLUE CROSS/BLUE SHIELD | Admitting: Orthopedic Surgery

## 2017-05-13 VITALS — BP 182/109 | HR 87 | Ht 70.0 in | Wt 228.0 lb

## 2017-05-13 DIAGNOSIS — Z8781 Personal history of (healed) traumatic fracture: Secondary | ICD-10-CM

## 2017-05-13 DIAGNOSIS — Z9889 Other specified postprocedural states: Secondary | ICD-10-CM

## 2017-05-13 DIAGNOSIS — Z967 Presence of other bone and tendon implants: Secondary | ICD-10-CM

## 2017-05-13 MED ORDER — DOXYCYCLINE HYCLATE 50 MG PO CAPS: 50.0000 mg | ORAL_CAPSULE | Freq: Two times a day (BID) | ORAL | 0 refills | Status: DC

## 2017-05-13 NOTE — Anesthesia Post-op Follow-up Note (Signed)
  Anesthesia Pain Follow-up Note  Patient: Reginald Stephens  Day #: 64  Date of Follow-up: 05/13/2017 Time: 2:53 PM  Day Surgery follow up note.  Pt six weeks post ORIF of left ankle Fx under general anesthesia, now c/o mild throat pain. Denies SOB or stridor. No dysphagia. Pt does have some sinus drainage and a hx of GERD with esophageal dilation several years prior. Anesthesia records reveal no difficulty with airway management.  Examination of pharynx reveals normal , non injected mucosa, without any lesions. No voice changes are noted, pt phonates normally.  Impression - doubtful of injury from the surgery, more likely he still has problems with GERD, or chronic sinusitis continuing to irritate the pharyngeal mucosa.  Plan - asked pt to continue conservative measures with saline gargles, etc, and contact ENT, Dr. Benjamine Mola (which pt states he has seen before)  if the symptoms persist.   [name]

## 2017-05-13 NOTE — Addendum Note (Signed)
Addendum  created 05/13/17 1508 by Lerry Liner, MD   Sign clinical note

## 2017-05-13 NOTE — Progress Notes (Signed)
POST OP VISIT   Patient ID: Reginald Stephens, male   DOB: 08-27-1950, 67 y.o.   MRN: [id number]  Chief Complaint  Patient presents with  . Post-op Problem    patient states drainage from incision left ankle yesterday    Encounter Diagnosis  Name Primary?  . S/P ORIF (open reduction internal fixation) fracture left ankle 03/19/17 Yes    Mr. Reginald Stephens has some drainage medial ankle.  He is nonweightbearing is about 7 weeks post  He is in a Cam walker  Everything looks good to me he has a scab over the area probably had some drainage he has no tenderness or erythema around either side he has painless passive range of motion of his ankle foot is plantigrade wounds look clean other than the scab on the medial side  Recommend resume doxycycline keep regular appointment I did allow him to partial weight-bear with boot and crutches

## 2017-05-27 ENCOUNTER — Encounter: Payer: Self-pay | Admitting: Orthopedic Surgery

## 2017-05-31 ENCOUNTER — Encounter: Payer: Self-pay | Admitting: Orthopedic Surgery

## 2017-05-31 DIAGNOSIS — Z9889 Other specified postprocedural states: Secondary | ICD-10-CM

## 2017-05-31 DIAGNOSIS — Z8781 Personal history of (healed) traumatic fracture: Secondary | ICD-10-CM

## 2017-05-31 DIAGNOSIS — S82842D Displaced bimalleolar fracture of left lower leg, subsequent encounter for closed fracture with routine healing: Secondary | ICD-10-CM

## 2017-05-31 MED ORDER — HYDROCODONE-ACETAMINOPHEN 5-325 MG PO TABS: 1.0000 | ORAL_TABLET | Freq: Three times a day (TID) | ORAL | 0 refills | Status: DC | PRN

## 2017-06-05 ENCOUNTER — Encounter: Payer: Self-pay | Admitting: Orthopedic Surgery

## 2017-06-05 ENCOUNTER — Ambulatory Visit (INDEPENDENT_AMBULATORY_CARE_PROVIDER_SITE_OTHER): Payer: Self-pay | Admitting: Orthopedic Surgery

## 2017-06-05 ENCOUNTER — Ambulatory Visit (INDEPENDENT_AMBULATORY_CARE_PROVIDER_SITE_OTHER): Payer: BLUE CROSS/BLUE SHIELD

## 2017-06-05 VITALS — BP 155/67 | HR 70 | Ht 70.0 in | Wt 228.0 lb

## 2017-06-05 DIAGNOSIS — Z967 Presence of other bone and tendon implants: Secondary | ICD-10-CM | POA: Diagnosis not present

## 2017-06-05 DIAGNOSIS — Z8781 Personal history of (healed) traumatic fracture: Secondary | ICD-10-CM

## 2017-06-05 DIAGNOSIS — Z9889 Other specified postprocedural states: Secondary | ICD-10-CM

## 2017-06-05 DIAGNOSIS — S82842D Displaced bimalleolar fracture of left lower leg, subsequent encounter for closed fracture with routine healing: Secondary | ICD-10-CM

## 2017-06-05 NOTE — Progress Notes (Signed)
POSTOP VISIT  Status post  orif left ankle   POD # 78  BP (!) 155/67   Pulse 70   Ht _0  (1.778 m)   Wt 228 lb (103.4 kg)   BMI 32.71 kg/m   C/O swelling at he end of the day    Encounter Diagnoses  Name Primary?  . S/P ORIF (open reduction internal fixation) fracture left ankle 03/19/17 Yes  . Closed bimalleolar fracture of left ankle with routine healing, subsequent encounter     The surgical site incision clean dry and intact with no drainage  The neurovascular examination of the extremity is normal in terms of sensation pulse and perfusion  Postoperative plan  Air cast  wbat  Ret to work apr 6  Fu 1 month routine ck up

## 2017-06-05 NOTE — Patient Instructions (Signed)
Return to work April 6

## 2017-06-10 ENCOUNTER — Ambulatory Visit: Payer: BLUE CROSS/BLUE SHIELD | Admitting: Orthopedic Surgery

## 2017-06-12 ENCOUNTER — Encounter: Payer: Self-pay | Admitting: Orthopedic Surgery

## 2017-06-13 ENCOUNTER — Encounter: Payer: Self-pay | Admitting: Orthopedic Surgery

## 2017-07-03 ENCOUNTER — Ambulatory Visit: Payer: BLUE CROSS/BLUE SHIELD | Admitting: Orthopedic Surgery

## 2017-07-05 ENCOUNTER — Encounter: Payer: Self-pay | Admitting: Orthopedic Surgery

## 2017-07-05 ENCOUNTER — Ambulatory Visit (INDEPENDENT_AMBULATORY_CARE_PROVIDER_SITE_OTHER): Payer: BLUE CROSS/BLUE SHIELD | Admitting: Orthopedic Surgery

## 2017-07-05 VITALS — BP 136/79 | HR 69 | Ht 70.0 in | Wt 225.0 lb

## 2017-07-05 DIAGNOSIS — Z8781 Personal history of (healed) traumatic fracture: Secondary | ICD-10-CM | POA: Diagnosis not present

## 2017-07-05 DIAGNOSIS — Z967 Presence of other bone and tendon implants: Secondary | ICD-10-CM

## 2017-07-05 DIAGNOSIS — Z9889 Other specified postprocedural states: Secondary | ICD-10-CM

## 2017-07-05 NOTE — Progress Notes (Signed)
Follow up   Encounter Diagnosis  Name Primary?  . S/P ORIF (open reduction internal fixation) fracture left ankle 03/19/17 Yes    Chief Complaint  Patient presents with  . Post-op Follow-up    03/19/17 ORIF left ankle, improving but c/o significant swelling     C/o swelling   He is not having any pain  He has returned to work with his brace on  That's going ok  His leg looks okay his incisions look fine is got a little bit of a scab at the top of the lateral incision he has no pain his ankle motion is about 15 degrees total his leg is swollen but it goes down when he elevates that he would like to wear the brace at work so that is fine I will see him in 2 months for routine checkup he will continue the brace at work

## 2017-07-14 ENCOUNTER — Encounter: Payer: Self-pay | Admitting: Orthopedic Surgery

## 2017-07-15 ENCOUNTER — Ambulatory Visit (INDEPENDENT_AMBULATORY_CARE_PROVIDER_SITE_OTHER): Payer: BLUE CROSS/BLUE SHIELD | Admitting: Orthopedic Surgery

## 2017-07-15 ENCOUNTER — Ambulatory Visit (INDEPENDENT_AMBULATORY_CARE_PROVIDER_SITE_OTHER): Payer: BLUE CROSS/BLUE SHIELD

## 2017-07-15 VITALS — BP 150/98 | HR 58 | Ht 71.0 in | Wt 225.0 lb

## 2017-07-15 DIAGNOSIS — M25572 Pain in left ankle and joints of left foot: Secondary | ICD-10-CM

## 2017-07-15 DIAGNOSIS — Z8781 Personal history of (healed) traumatic fracture: Secondary | ICD-10-CM | POA: Diagnosis not present

## 2017-07-15 DIAGNOSIS — Z967 Presence of other bone and tendon implants: Secondary | ICD-10-CM

## 2017-07-15 DIAGNOSIS — Z9889 Other specified postprocedural states: Secondary | ICD-10-CM

## 2017-07-15 NOTE — Progress Notes (Signed)
Chief Complaint  Patient presents with  . Follow-up    Recheck on left ankle, DOS 03-19-17.    Patient had left ankle surgery did well except for some swelling which she had some chronic edema prior to surgery.  He had some drainage from the proximal portion of his wound when his scab came off in the bathroom he presents for evaluation and treatment no complaints of pain  He is been using Neosporin on the wound with good success  Review of systems no history of fever chills or wound erythema  X-ray today shows a fracture is healing well hardware is intact no sign of bony infection  Physical Exam  Constitutional: He is oriented to person, place, and time. He appears well-developed and well-nourished.  Vital signs have been reviewed and are stable. Gen. appearance the patient is well-developed and well-nourished with normal grooming and hygiene.   Neurological: He is alert and oriented to person, place, and time.  Ambulates without any significant alteration in gait  Skin: Skin is warm and dry. No erythema.  Psychiatric: He has a normal mood and affect.  Vitals reviewed.   examination of the ankle shows the proximal wound has a little scab that came off there is no erythema no warmth no pain good range of motion and again x-ray is normal  Recommend contact you with Neosporin treatment covered with a Band-Aid follow-up as previously scheduled

## 2017-07-15 NOTE — Patient Instructions (Signed)
Keep appointments as scheduled.

## 2017-07-22 ENCOUNTER — Encounter: Payer: Self-pay | Admitting: Orthopedic Surgery

## 2017-08-12 ENCOUNTER — Encounter: Payer: Self-pay | Admitting: Orthopedic Surgery

## 2017-08-12 ENCOUNTER — Telehealth: Payer: Self-pay | Admitting: Orthopedic Surgery

## 2017-08-12 DIAGNOSIS — E119 Type 2 diabetes mellitus without complications: Secondary | ICD-10-CM | POA: Diagnosis not present

## 2017-08-12 DIAGNOSIS — E6609 Other obesity due to excess calories: Secondary | ICD-10-CM | POA: Diagnosis not present

## 2017-08-12 DIAGNOSIS — B359 Dermatophytosis, unspecified: Secondary | ICD-10-CM | POA: Diagnosis not present

## 2017-08-12 DIAGNOSIS — Z6833 Body mass index (BMI) 33.0-33.9, adult: Secondary | ICD-10-CM | POA: Diagnosis not present

## 2017-08-12 DIAGNOSIS — E1129 Type 2 diabetes mellitus with other diabetic kidney complication: Secondary | ICD-10-CM | POA: Diagnosis not present

## 2017-08-12 DIAGNOSIS — R600 Localized edema: Secondary | ICD-10-CM | POA: Diagnosis not present

## 2017-08-12 NOTE — Telephone Encounter (Signed)
Yes the stockings will help

## 2017-08-12 NOTE — Telephone Encounter (Signed)
Replied via mychart.

## 2017-08-12 NOTE — Telephone Encounter (Signed)
Patient called stating that his family doctor says he may benefit from compression socks and is suppose to "size" him for them tomorrow.  Patient is asking if Dr. Aline Brochure thought the compression socks would help him with the swelling in his left ankle. He asked if he could be notified by MyChart with the answer to this question.

## 2017-09-04 ENCOUNTER — Ambulatory Visit (INDEPENDENT_AMBULATORY_CARE_PROVIDER_SITE_OTHER): Payer: BLUE CROSS/BLUE SHIELD | Admitting: Orthopedic Surgery

## 2017-09-04 ENCOUNTER — Encounter: Payer: Self-pay | Admitting: Orthopedic Surgery

## 2017-09-04 VITALS — BP 133/80 | HR 48 | Ht 71.0 in | Wt 230.0 lb

## 2017-09-04 DIAGNOSIS — Z967 Presence of other bone and tendon implants: Secondary | ICD-10-CM

## 2017-09-04 DIAGNOSIS — Z8781 Personal history of (healed) traumatic fracture: Secondary | ICD-10-CM

## 2017-09-04 DIAGNOSIS — Z9889 Other specified postprocedural states: Secondary | ICD-10-CM

## 2017-09-04 NOTE — Progress Notes (Signed)
Chief Complaint  Patient presents with  . Post-op Follow-up    03/19/17     67 year old male works at [employer] status post open treatment internal fixation left ankle January 2015  Only complaint is chronic edema in the left ankle.  He is controlling this with elevation and when he wakes up in the morning all the swelling is down but by the end of the day his leg is swollen.  However, he has started wearing a compression hose and that is controlling the swelling much better    Physical Exam  Musculoskeletal:       Left ankle: He exhibits swelling. He exhibits normal range of motion, no deformity, no laceration and normal pulse.       Feet:   Encounter Diagnosis  Name Primary?  . S/P ORIF (open reduction internal fixation) fracture left ankle 03/19/17 Yes    FU JAN FOR 1 YR POST OP XRAYS

## 2017-11-26 ENCOUNTER — Other Ambulatory Visit (HOSPITAL_COMMUNITY)
Admission: AD | Admit: 2017-11-26 | Discharge: 2017-11-26 | Disposition: A | Discharge status: Home / Self Care | Payer: BLUE CROSS/BLUE SHIELD | Source: Skilled Nursing Facility | Attending: Urology | Admitting: Urology

## 2017-11-26 ENCOUNTER — Ambulatory Visit: Payer: BLUE CROSS/BLUE SHIELD | Admitting: Urology

## 2017-11-26 DIAGNOSIS — R310 Gross hematuria: Secondary | ICD-10-CM | POA: Insufficient documentation

## 2017-11-26 DIAGNOSIS — N200 Calculus of kidney: Secondary | ICD-10-CM | POA: Diagnosis not present

## 2017-11-27 LAB — URINE CULTURE: CULTURE: NO GROWTH

## 2017-11-28 ENCOUNTER — Encounter (INDEPENDENT_AMBULATORY_CARE_PROVIDER_SITE_OTHER): Payer: Self-pay

## 2017-12-02 ENCOUNTER — Ambulatory Visit (HOSPITAL_COMMUNITY)
Admission: RE | Admit: 2017-12-02 | Discharge: 2017-12-02 | Disposition: A | Discharge status: Home / Self Care | Payer: BLUE CROSS/BLUE SHIELD | Source: Ambulatory Visit | Attending: Urology | Admitting: Urology

## 2017-12-02 ENCOUNTER — Other Ambulatory Visit: Payer: Self-pay | Admitting: Urology

## 2017-12-02 DIAGNOSIS — N200 Calculus of kidney: Secondary | ICD-10-CM

## 2017-12-04 ENCOUNTER — Telehealth (INDEPENDENT_AMBULATORY_CARE_PROVIDER_SITE_OTHER): Payer: Self-pay | Admitting: Internal Medicine

## 2017-12-04 DIAGNOSIS — K219 Gastro-esophageal reflux disease without esophagitis: Secondary | ICD-10-CM

## 2017-12-04 MED ORDER — PANTOPRAZOLE SODIUM 40 MG PO TBEC: 40.0000 mg | DELAYED_RELEASE_TABLET | Freq: Every day | ORAL | 3 refills | Status: DC

## 2017-12-04 NOTE — Telephone Encounter (Signed)
Rx sent to [facility].

## 2017-12-05 ENCOUNTER — Telehealth (INDEPENDENT_AMBULATORY_CARE_PROVIDER_SITE_OTHER): Payer: Self-pay | Admitting: Internal Medicine

## 2017-12-05 DIAGNOSIS — H6983 Other specified disorders of Eustachian tube, bilateral: Secondary | ICD-10-CM | POA: Diagnosis not present

## 2017-12-05 DIAGNOSIS — J22 Unspecified acute lower respiratory infection: Secondary | ICD-10-CM | POA: Diagnosis not present

## 2017-12-05 DIAGNOSIS — K219 Gastro-esophageal reflux disease without esophagitis: Secondary | ICD-10-CM

## 2017-12-05 DIAGNOSIS — E6609 Other obesity due to excess calories: Secondary | ICD-10-CM | POA: Diagnosis not present

## 2017-12-05 DIAGNOSIS — Z6833 Body mass index (BMI) 33.0-33.9, adult: Secondary | ICD-10-CM | POA: Diagnosis not present

## 2017-12-05 DIAGNOSIS — Z1389 Encounter for screening for other disorder: Secondary | ICD-10-CM | POA: Diagnosis not present

## 2017-12-05 MED ORDER — PANTOPRAZOLE SODIUM 40 MG PO TBEC: 40.0000 mg | DELAYED_RELEASE_TABLET | Freq: Every day | ORAL | 3 refills | Status: DC

## 2017-12-05 NOTE — Telephone Encounter (Signed)
[place]

## 2017-12-09 ENCOUNTER — Telehealth (INDEPENDENT_AMBULATORY_CARE_PROVIDER_SITE_OTHER): Payer: Self-pay | Admitting: Internal Medicine

## 2017-12-09 DIAGNOSIS — K219 Gastro-esophageal reflux disease without esophagitis: Secondary | ICD-10-CM

## 2017-12-09 MED ORDER — PANTOPRAZOLE SODIUM 40 MG PO TBEC: 40.0000 mg | DELAYED_RELEASE_TABLET | Freq: Every day | ORAL | 3 refills | Status: DC

## 2017-12-09 NOTE — Telephone Encounter (Signed)
Rx sent to his pharmacy ([facility])

## 2017-12-11 ENCOUNTER — Encounter (INDEPENDENT_AMBULATORY_CARE_PROVIDER_SITE_OTHER): Payer: Self-pay

## 2017-12-12 ENCOUNTER — Encounter (INDEPENDENT_AMBULATORY_CARE_PROVIDER_SITE_OTHER): Payer: Self-pay

## 2017-12-12 ENCOUNTER — Telehealth (INDEPENDENT_AMBULATORY_CARE_PROVIDER_SITE_OTHER): Payer: Self-pay | Admitting: *Deleted

## 2017-12-12 ENCOUNTER — Other Ambulatory Visit (INDEPENDENT_AMBULATORY_CARE_PROVIDER_SITE_OTHER): Payer: Self-pay | Admitting: Internal Medicine

## 2017-12-12 MED ORDER — PANTOPRAZOLE SODIUM 40 MG PO TBEC: 40.0000 mg | DELAYED_RELEASE_TABLET | Freq: Every day | ORAL | 3 refills | Status: DC

## 2017-12-12 NOTE — Telephone Encounter (Signed)
Rx sent to [facility] in [place]

## 2017-12-12 NOTE — Telephone Encounter (Signed)
Please send Rx for [facility]oprazole to [facility]

## 2018-01-20 ENCOUNTER — Ambulatory Visit (INDEPENDENT_AMBULATORY_CARE_PROVIDER_SITE_OTHER): Payer: BLUE CROSS/BLUE SHIELD | Admitting: Internal Medicine

## 2018-01-20 ENCOUNTER — Encounter (INDEPENDENT_AMBULATORY_CARE_PROVIDER_SITE_OTHER): Payer: Self-pay | Admitting: Internal Medicine

## 2018-01-20 VITALS — BP 130/62 | HR 72 | Temp 98.4°F | Ht 71.0 in | Wt 244.7 lb

## 2018-01-20 DIAGNOSIS — K219 Gastro-esophageal reflux disease without esophagitis: Secondary | ICD-10-CM

## 2018-01-20 NOTE — Progress Notes (Signed)
Subjective:    Patient ID: Reginald Stephens, male    DOB: 1950/09/07, 67 y.o.   MRN: [id number]  HPI Presents today with c/o GERD. Her tells me he is doing good. He says his chest becomes tight at times. He feels bloated.  He says he ate pinto beans late during the day and had to sit up in a chair because he felt the acid reflux was coming up. His appetite is good.  No dysphagia.  BMs are moving okay. He says the Dexilant is helping. He is not avoiding fatty foods. He quit smoking in January 7 of this year.     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 Small sliding hiatal hernia. Normal examination of stomach versus second part of the duodenum. Esophagus dilated by passing 54 and 56 French Maloney dilators but no mucosal disruption induced.  Review of Systems Past Medical History:  Diagnosis Date  . Arthritis   . Asthma   . Chronic kidney disease   . Diabetes mellitus   . GERD (gastroesophageal reflux disease)   . History of kidney stones   . Hyperlipidemia   . Hypertension   . Palpitations     Past Surgical History:  Procedure Laterality Date  . COLONOSCOPY  02/15/2011   Procedure: COLONOSCOPY;  Surgeon: Rogene Houston, MD;  [facility];  Service: Endoscopy;  Laterality: N/A;  . CYSTOSCOPY  2011  . ESOPHAGEAL DILATION N/A 07/08/2014   Procedure: ESOPHAGEAL DILATION;  Surgeon: Rogene Houston, MD;  [facility];  Service: Endoscopy;  Laterality: N/A;  . ESOPHAGOGASTRODUODENOSCOPY N/A 07/08/2014   Procedure: ESOPHAGOGASTRODUODENOSCOPY (EGD);  Surgeon: Rogene Houston, MD;  [facility];  Service: Endoscopy;  Laterality: N/A;  245  . ORIF ANKLE FRACTURE Left 03/19/2017   Procedure: OPEN REDUCTION INTERNAL FIXATION (ORIF) LEFT ANKLE FRACTURE;  Surgeon: Carole Civil, MD;  Location: [facility];  Service: Orthopedics;  Laterality: Left;    . TONSILLECTOMY     age 67  . TONSILLECTOMY      Allergies  Allergen Reactions  . Shellfish Allergy     Current Outpatient Medications on File Prior to Visit  Medication Sig Dispense Refill  . aspirin EC 81 MG tablet Take 81 mg by mouth daily.      Marland Kitchen atorvastatin (LIPITOR) 20 MG tablet Take 20 mg by mouth every morning.      . Cholecalciferol (VITAMIN D3) 50000 units CAPS TAKE ONE CAPSULE BY MOUTH A WEEK  11  . clotrimazole-betamethasone (LOTRISONE) cream APPLY TO AFFECTED AREA TWICE A DAY FOR 10 DAYS  1  . DEXILANT 60 MG capsule TAKE 1 CAPSULE (60 MG TOTAL) BY MOUTH DAILY. 90 capsule 3  . diltiazem (CARTIA XT) 300 MG 24 hr capsule Take 300 mg by mouth daily.      . fluticasone (FLONASE) 50 MCG/ACT nasal spray USE 2 SPRAYS EACH NOSTRILS DAILY  1  . lisinopril-hydrochlorothiazide (PRINZIDE,ZESTORETIC) 20-25 MG per tablet Take 1 tablet by mouth daily.     . metFORMIN (GLUCOPHAGE) 1000 MG tablet Take 1,000 mg by mouth 2 (two) times daily with a meal.      . pantoprazole (PROTONIX) 40 MG tablet Take 1 tablet (40 mg total) by mouth daily. (Patient not taking: Reported on 01/20/2018) 90 tablet 3   No current facility-administered medications on file prior to visit.  Objective:   Physical Exam Blood pressure 130/62, pulse 72, temperature 98.4 F (36.9 C), height 5\' 11"  (1.803 m), weight 244 lb 11.2 oz (111 kg). Alert and oriented. Skin warm and dry. Oral mucosa is moist.   . Sclera anicteric, conjunctivae is pink. Thyroid not enlarged. No cervical lymphadenopathy. Lungs clear. Heart regular rate and rhythm.  Abdomen is soft. Bowel sounds are positive. No hepatomegaly. No abdominal masses felt. No tenderness.  No edema to lower extremities. Patient is alert and oriented.         Assessment & Plan:  GERD. Continue the Dexilant. Will resume Protonix in 3 months after he finishes the [employer]

## 2018-01-20 NOTE — Patient Instructions (Signed)
OV in 1 year.  

## 2018-02-04 ENCOUNTER — Encounter (INDEPENDENT_AMBULATORY_CARE_PROVIDER_SITE_OTHER): Payer: Self-pay | Admitting: *Deleted

## 2018-03-01 ENCOUNTER — Emergency Department (HOSPITAL_COMMUNITY)
Admission: EM | Admit: 2018-03-01 | Discharge: 2018-03-01 | Disposition: A | Discharge status: Home / Self Care | Payer: No Typology Code available for payment source | Attending: Emergency Medicine | Admitting: Emergency Medicine

## 2018-03-01 ENCOUNTER — Other Ambulatory Visit: Payer: Self-pay

## 2018-03-01 ENCOUNTER — Encounter (HOSPITAL_COMMUNITY): Payer: Self-pay | Admitting: Emergency Medicine

## 2018-03-01 DIAGNOSIS — Z79899 Other long term (current) drug therapy: Secondary | ICD-10-CM | POA: Insufficient documentation

## 2018-03-01 DIAGNOSIS — Y9389 Activity, other specified: Secondary | ICD-10-CM | POA: Insufficient documentation

## 2018-03-01 DIAGNOSIS — Z7984 Long term (current) use of oral hypoglycemic drugs: Secondary | ICD-10-CM | POA: Diagnosis not present

## 2018-03-01 DIAGNOSIS — E119 Type 2 diabetes mellitus without complications: Secondary | ICD-10-CM | POA: Diagnosis not present

## 2018-03-01 DIAGNOSIS — Y92512 Supermarket, store or market as the place of occurrence of the external cause: Secondary | ICD-10-CM | POA: Diagnosis not present

## 2018-03-01 DIAGNOSIS — S61012A Laceration without foreign body of left thumb without damage to nail, initial encounter: Secondary | ICD-10-CM | POA: Diagnosis not present

## 2018-03-01 DIAGNOSIS — Y990 Civilian activity done for income or pay: Secondary | ICD-10-CM | POA: Insufficient documentation

## 2018-03-01 DIAGNOSIS — Z7982 Long term (current) use of aspirin: Secondary | ICD-10-CM | POA: Diagnosis not present

## 2018-03-01 DIAGNOSIS — I10 Essential (primary) hypertension: Secondary | ICD-10-CM | POA: Insufficient documentation

## 2018-03-01 DIAGNOSIS — J45909 Unspecified asthma, uncomplicated: Secondary | ICD-10-CM | POA: Diagnosis not present

## 2018-03-01 DIAGNOSIS — W268XXA Contact with other sharp object(s), not elsewhere classified, initial encounter: Secondary | ICD-10-CM | POA: Insufficient documentation

## 2018-03-01 MED ORDER — LIDOCAINE HCL (PF) 1 % IJ SOLN
5.0000 mL | Freq: Once | INTRAMUSCULAR | Status: AC
  Administered 2018-03-01: 5 mL via INTRADERMAL
  Filled 2018-03-01: qty 6

## 2018-03-01 MED ORDER — POVIDONE-IODINE 10 % EX SOLN
CUTANEOUS | Status: DC | PRN
  Administered 2018-03-01: 14:00:00 via TOPICAL
  Filled 2018-03-01: qty 30

## 2018-03-01 NOTE — Discharge Instructions (Addendum)
Keep the wound clean with mild soap and water and keep it bandaged especially while at work.  Sutures out in 8 to 10 days.  Return to the ER for any signs of infection such as increasing pain, redness, or swelling.  You may take Tylenol every 4 hours if needed for pain.

## 2018-03-01 NOTE — ED Provider Notes (Signed)
[facility] EMERGENCY DEPARTMENT Provider Note   CSN: [id number] Arrival date & time: 03/01/18  1318     History   Chief Complaint Chief Complaint  Patient presents with  . Laceration    HPI Reginald Stephens is a 67 y.o. male.  HPI   Reginald Stephens is a 67 y.o. male who presents to the Emergency Department complaining of laceration of his proximal left thumb that occurred shortly before ER arrival.  This is a work-related injury.  He states that he works at [employer] and was moving a refrigerator that was in a box banded with a metal strap.  As he moved a box the strap cut his proximal thumb.  Bleeding was controlled using direct pressure.  He denies numbness, swelling, or weakness of the finger or hand.  Last TD is up-to-date.  He takes 81 mg aspirin daily.  Past Medical History:  Diagnosis Date  . Arthritis   . Asthma   . Chronic kidney disease   . Diabetes mellitus   . GERD (gastroesophageal reflux disease)   . History of kidney stones   . Hyperlipidemia   . Hypertension   . Palpitations     Patient Active Problem List   Diagnosis Date Noted  . S/P ORIF (open reduction internal fixation) fracture left ankle 03/19/17 03/25/2017  . Closed bimalleolar fracture of left ankle   . Palpitations 03/01/2015  . Essential hypertension 08/25/2012  . Hyperlipidemia 08/25/2012  . Diabetes ([place]) 08/25/2012    Past Surgical History:  Procedure Laterality Date  . COLONOSCOPY  02/15/2011   Procedure: COLONOSCOPY;  Surgeon: Rogene Houston, MD;  [facility];  Service: Endoscopy;  Laterality: N/A;  . CYSTOSCOPY  2011  . ESOPHAGEAL DILATION N/A 07/08/2014   Procedure: ESOPHAGEAL DILATION;  Surgeon: Rogene Houston, MD;  [facility];  Service: Endoscopy;  Laterality: N/A;  . ESOPHAGOGASTRODUODENOSCOPY N/A 07/08/2014   Procedure: ESOPHAGOGASTRODUODENOSCOPY (EGD);  Surgeon: Rogene Houston, MD;  [facility];  Service: Endoscopy;   Laterality: N/A;  245  . ORIF ANKLE FRACTURE Left 03/19/2017   Procedure: OPEN REDUCTION INTERNAL FIXATION (ORIF) LEFT ANKLE FRACTURE;  Surgeon: Carole Civil, MD;  Location: [facility];  Service: Orthopedics;  Laterality: Left;  . TONSILLECTOMY     age 54  . TONSILLECTOMY        Home Medications    Prior to Admission medications   Medication Sig Start Date End Date Taking? Authorizing Provider  aspirin EC 81 MG tablet Take 81 mg by mouth daily.      [provider]  atorvastatin (LIPITOR) 20 MG tablet Take 20 mg by mouth every morning.      [provider]  Cholecalciferol (VITAMIN D3) 50000 units CAPS TAKE ONE CAPSULE BY MOUTH A WEEK 12/18/16   [provider]  clotrimazole-betamethasone (LOTRISONE) cream APPLY TO AFFECTED AREA TWICE A DAY FOR 10 DAYS 08/12/17   [provider]  DEXILANT 60 MG capsule TAKE 1 CAPSULE (60 MG TOTAL) BY MOUTH DAILY. 03/11/17   Setzer, Rona Ravens, NP  diltiazem (CARTIA XT) 300 MG 24 hr capsule Take 300 mg by mouth daily.      [provider]  fluticasone (FLONASE) 50 MCG/ACT nasal spray USE 2 SPRAYS EACH NOSTRILS DAILY 02/23/17   [provider]  lisinopril-hydrochlorothiazide (PRINZIDE,ZESTORETIC) 20-25 MG per tablet Take 1 tablet by mouth daily.  08/10/12   [provider]  metFORMIN (GLUCOPHAGE) 1000 MG tablet Take  1,000 mg by mouth 2 (two) times daily with a meal.      [provider]  pantoprazole (PROTONIX) 40 MG tablet Take 1 tablet (40 mg total) by mouth daily. Patient not taking: Reported on 01/20/2018 12/12/17   Butch Penny, NP    Family History Family History  Problem Relation Age of Onset  . Heart attack Maternal Grandmother   . Stroke Maternal Grandfather     Social History Social History   Tobacco Use  . Smoking status: Former Smoker    Packs/day: 1.00    Years: 25.00    Pack years: 25.00    Types: Cigarettes    Last attempt to quit: 03/19/2017    Years since  quitting: 0.9  . Smokeless tobacco: Never Used  Substance Use Topics  . Alcohol use: Yes    Alcohol/week: 12.0 standard drinks    Types: 12 Cans of beer per week  . Drug use: No     Allergies   Shellfish allergy   Review of Systems Review of Systems  Constitutional: Negative for chills and fever.  Musculoskeletal: Negative for arthralgias, back pain and joint swelling.  Skin: Positive for wound. Negative for color change.       Laceration left thumb  Neurological: Negative for dizziness, weakness and numbness.  Hematological: Does not bruise/bleed easily.     Physical Exam Updated Vital Signs BP (!) 145/67 (BP Location: Right Arm)   Pulse 84   Temp 98.3 F (36.8 C) (Oral)   Resp 18   Ht 5\' 10"  (1.778 m)   Wt 106.6 kg   SpO2 99%   BMI 33.72 kg/m   Physical Exam Vitals signs and nursing note reviewed.  Constitutional:      General: He is not in acute distress.    Appearance: He is well-developed.  HENT:     Head: Normocephalic and atraumatic.  Cardiovascular:     Rate and Rhythm: Normal rate and regular rhythm.     Heart sounds: Normal heart sounds. No murmur.  Pulmonary:     Effort: Pulmonary effort is normal. No respiratory distress.     Breath sounds: Normal breath sounds.  Musculoskeletal:        General: Signs of injury present. No swelling or tenderness.     Left hand: He exhibits laceration. He exhibits no tenderness, normal two-point discrimination, normal capillary refill and no swelling. Normal sensation noted. Normal strength noted. He exhibits no finger abduction, no thumb/finger opposition and no wrist extension trouble.       Hands:     Comments: 1 and half centimeter laceration of the proximal left thumb.  Bleeding controlled.  No edema.  No foreign bodies.  Patient has normal finger thumb opposition, no motor weakness.  Skin:    General: Skin is warm.     Capillary Refill: Capillary refill takes less than 2 seconds.     Findings: No  laceration.  Neurological:     Mental Status: He is alert and oriented to person, place, and time.     Motor: No abnormal muscle tone.     Coordination: Coordination normal.      ED Treatments / Results  Labs (all labs ordered are listed, but only abnormal results are displayed) Labs Reviewed - No data to display  EKG None  Radiology No results found.  Procedures Procedures (including critical care time)  LACERATION REPAIR Performed by: Axell Trigueros Authorized by: Joury Allcorn Consent: Verbal consent obtained. Risks and benefits: risks,  benefits and alternatives were discussed Consent given by: patient Patient identity confirmed: provided demographic data Prepped and Draped in normal sterile fashion Wound explored  Laceration Location: left thumb  Laceration Length: 1.5 cm  No Foreign Bodies seen or palpated  Anesthesia: local infiltration  Local anesthetic: lidocaine 1 % w/o epinephrine  Anesthetic total: 2 ml  Irrigation method: syringe Amount of cleaning: standard  Skin closure: 4-0 prolene  Number of sutures: 3  Technique: simple interrupted  Patient tolerance: Patient tolerated the procedure well with no immediate complications.   Medications Ordered in ED Medications  lidocaine (PF) (XYLOCAINE) 1 % injection 5 mL (has no administration in time range)  povidone-iodine (BETADINE) 10 % external solution (has no administration in time range)     Initial Impression / Assessment and Plan / ED Course  I have reviewed the triage vital signs and the nursing notes.  Pertinent labs & imaging results that were available during my care of the patient were reviewed by me and considered in my medical decision making (see chart for details).     Superficial flap type laceration to the proximal left thumb.  Bleeding controlled prior to closure.  Wound was explored and no foreign body seen.  Remains neurovascularly intact.  Last TD less than 10 years.   No motor deficits.  Patient agrees to wound care instructions and sutures out in 8 to 10 days.  Final Clinical Impressions(s) / ED Diagnoses   Final diagnoses:  Laceration of left thumb without foreign body without damage to nail, initial encounter    ED Discharge Orders    None       Kem Parkinson, PA-C 03/01/18 1427    Fredia Sorrow, MD 03/02/18 [id number]

## 2018-03-01 NOTE — ED Notes (Signed)
ED Provider at bedside. 

## 2018-03-01 NOTE — ED Triage Notes (Signed)
Patient c/o laceration to left thumb. Per patient cut on metal band while moving a refrigerator at work. Patient states last tetanus vaccination within 5-10 years. Bleeding controlled. [employer] in [place].

## 2018-03-07 ENCOUNTER — Ambulatory Visit: Payer: BLUE CROSS/BLUE SHIELD | Admitting: Orthopedic Surgery

## 2018-03-19 ENCOUNTER — Encounter: Payer: Self-pay | Admitting: Orthopedic Surgery

## 2018-03-19 ENCOUNTER — Ambulatory Visit: Payer: Medicare HMO | Admitting: Orthopedic Surgery

## 2018-03-19 ENCOUNTER — Ambulatory Visit (INDEPENDENT_AMBULATORY_CARE_PROVIDER_SITE_OTHER): Payer: Medicare HMO

## 2018-03-19 VITALS — BP 134/77 | HR 84 | Ht 71.0 in | Wt 235.0 lb

## 2018-03-19 DIAGNOSIS — Z9889 Other specified postprocedural states: Secondary | ICD-10-CM

## 2018-03-19 DIAGNOSIS — Z8781 Personal history of (healed) traumatic fracture: Secondary | ICD-10-CM

## 2018-03-19 NOTE — Progress Notes (Signed)
Progress Note   Patient ID: Reginald Stephens, male   DOB: 11-Jun-1950, 68 y.o.   MRN: [id number]   Chief Complaint  Patient presents with  . Post-op Follow-up    1 yr s/p ORIF left ankle     68 year old male works at [employer] had an ankle fracture last year comes in for 1 year follow-up.  He is doing very well he does have occasional medial ankle pain and still has swelling at the end of the day requiring use of compression stockings   03/19/2017  3:57 PM  PATIENT:  Reginald Stephens  68 y.o. male  PRE-OPERATIVE DIAGNOSIS:  bimalleolar fracture left ankle  POST-OPERATIVE DIAGNOSIS:  bimalleolar fracture left ankle  PROCEDURE:  Procedure(s): OPEN REDUCTION INTERNAL FIXATION (ORIF) LEFT ANKLE FRACTURE (Left) 02637  SURGEON:  Surgeon(s) and Role:    Carole Civil, MD - Primary  Operative findings bimalleolar fracture Weber C syndesmosis rupture up to the level of the fracture  Closed fracture  Implant [employer] ankle solutions 10 hole plate laterally 2 K wires laterally two syndesmosis screws 1 4 cortex 1 3 cortex 3 medial K wires   [id number]    Review of Systems  Cardiovascular:       LEFT ANKLE DEPENDENT EDEMA   Neurological: Negative for tingling.     Allergies  Allergen Reactions  . Shellfish Allergy      There were no vitals taken for this visit.  His vital signs are blood pressure 134/74 pulse 84 weight 235 height [id number]  Physical Exam Vitals signs reviewed.  Constitutional:      Appearance: He is well-developed.     Comments: Vital signs have been reviewed and are stable. Gen. appearance the patient is well-developed and well-nourished with normal grooming and hygiene.   Musculoskeletal:     Left ankle: He exhibits swelling. He exhibits normal range of motion and no deformity. No lateral malleolus, no medial malleolus and no AITFL tenderness found. Achilles tendon exhibits no pain.       Feet:  Skin:    General: Skin is warm and dry.     Findings: No  erythema.  Neurological:     Mental Status: He is alert and oriented to person, place, and time.      Medical decisions:   Data  Imaging:   SEE DICTATED REPORT stable fixation without any significant degenerative changes  Encounter Diagnosis  Name Primary?  . S/P ORIF (open reduction internal fixation) fracture left ankle 03/19/17 Yes    PLAN:   Patient is released    Arther Abbott, MD 03/19/2018 8:57 AM

## 2018-07-05 ENCOUNTER — Encounter (INDEPENDENT_AMBULATORY_CARE_PROVIDER_SITE_OTHER): Payer: Self-pay

## 2018-07-17 DIAGNOSIS — E1129 Type 2 diabetes mellitus with other diabetic kidney complication: Secondary | ICD-10-CM | POA: Diagnosis not present

## 2018-07-17 DIAGNOSIS — E7849 Other hyperlipidemia: Secondary | ICD-10-CM | POA: Diagnosis not present

## 2018-07-17 DIAGNOSIS — Z6834 Body mass index (BMI) 34.0-34.9, adult: Secondary | ICD-10-CM | POA: Diagnosis not present

## 2018-07-17 DIAGNOSIS — E782 Mixed hyperlipidemia: Secondary | ICD-10-CM | POA: Diagnosis not present

## 2018-07-17 DIAGNOSIS — E119 Type 2 diabetes mellitus without complications: Secondary | ICD-10-CM | POA: Diagnosis not present

## 2018-07-17 DIAGNOSIS — Z1389 Encounter for screening for other disorder: Secondary | ICD-10-CM | POA: Diagnosis not present

## 2018-07-17 DIAGNOSIS — Z0001 Encounter for general adult medical examination with abnormal findings: Secondary | ICD-10-CM | POA: Diagnosis not present

## 2018-07-17 DIAGNOSIS — Z125 Encounter for screening for malignant neoplasm of prostate: Secondary | ICD-10-CM | POA: Diagnosis not present

## 2018-07-17 DIAGNOSIS — M6200 Separation of muscle (nontraumatic), unspecified site: Secondary | ICD-10-CM | POA: Diagnosis not present

## 2018-07-17 DIAGNOSIS — B351 Tinea unguium: Secondary | ICD-10-CM | POA: Diagnosis not present

## 2018-07-18 DIAGNOSIS — E1129 Type 2 diabetes mellitus with other diabetic kidney complication: Secondary | ICD-10-CM | POA: Diagnosis not present

## 2018-07-18 DIAGNOSIS — D649 Anemia, unspecified: Secondary | ICD-10-CM | POA: Diagnosis not present

## 2018-07-18 DIAGNOSIS — E119 Type 2 diabetes mellitus without complications: Secondary | ICD-10-CM | POA: Diagnosis not present

## 2018-11-25 ENCOUNTER — Telehealth (INDEPENDENT_AMBULATORY_CARE_PROVIDER_SITE_OTHER): Payer: Self-pay | Admitting: Nurse Practitioner

## 2018-11-25 NOTE — Telephone Encounter (Signed)
Reginald Stephens, pls call patient and let him know I refilled his Pantoprazole RX, pls schedule him for his annual office visit due 01/2019. thx

## 2018-12-01 DIAGNOSIS — R69 Illness, unspecified: Secondary | ICD-10-CM | POA: Diagnosis not present

## 2019-01-13 ENCOUNTER — Ambulatory Visit (INDEPENDENT_AMBULATORY_CARE_PROVIDER_SITE_OTHER): Payer: Medicare HMO | Admitting: Urology

## 2019-01-13 DIAGNOSIS — N529 Male erectile dysfunction, unspecified: Secondary | ICD-10-CM

## 2019-01-13 DIAGNOSIS — N200 Calculus of kidney: Secondary | ICD-10-CM | POA: Diagnosis not present

## 2019-01-13 DIAGNOSIS — R310 Gross hematuria: Secondary | ICD-10-CM

## 2019-01-21 ENCOUNTER — Ambulatory Visit (INDEPENDENT_AMBULATORY_CARE_PROVIDER_SITE_OTHER): Payer: Medicare HMO | Admitting: Nurse Practitioner

## 2019-01-22 ENCOUNTER — Other Ambulatory Visit: Payer: Self-pay

## 2019-01-22 ENCOUNTER — Other Ambulatory Visit (HOSPITAL_COMMUNITY): Payer: Self-pay | Admitting: Urology

## 2019-01-22 ENCOUNTER — Ambulatory Visit (HOSPITAL_COMMUNITY)
Admission: RE | Admit: 2019-01-22 | Discharge: 2019-01-22 | Disposition: A | Discharge status: Home / Self Care | Payer: Medicare HMO | Source: Ambulatory Visit | Attending: Urology | Admitting: Urology

## 2019-01-22 DIAGNOSIS — N200 Calculus of kidney: Secondary | ICD-10-CM

## 2019-01-22 DIAGNOSIS — R109 Unspecified abdominal pain: Secondary | ICD-10-CM | POA: Diagnosis not present

## 2019-02-20 ENCOUNTER — Other Ambulatory Visit (INDEPENDENT_AMBULATORY_CARE_PROVIDER_SITE_OTHER): Payer: Self-pay | Admitting: *Deleted

## 2019-02-20 DIAGNOSIS — K219 Gastro-esophageal reflux disease without esophagitis: Secondary | ICD-10-CM

## 2019-02-20 MED ORDER — PANTOPRAZOLE SODIUM 40 MG PO TBEC: 40.0000 mg | DELAYED_RELEASE_TABLET | Freq: Every day | ORAL | 3 refills | Status: DC

## 2019-02-25 ENCOUNTER — Other Ambulatory Visit (INDEPENDENT_AMBULATORY_CARE_PROVIDER_SITE_OTHER): Payer: Self-pay | Admitting: Gastroenterology

## 2019-02-25 DIAGNOSIS — K219 Gastro-esophageal reflux disease without esophagitis: Secondary | ICD-10-CM

## 2019-02-25 MED ORDER — PANTOPRAZOLE SODIUM 40 MG PO TBEC: 40.0000 mg | DELAYED_RELEASE_TABLET | Freq: Every day | ORAL | 0 refills | Status: DC

## 2019-02-25 NOTE — Progress Notes (Signed)
Patient was called and a message was left. Asking the patient to let us know exactly what PPI he is taking. We ask that he take one or the other.

## 2019-03-12 ENCOUNTER — Encounter (INDEPENDENT_AMBULATORY_CARE_PROVIDER_SITE_OTHER): Payer: Self-pay

## 2019-03-16 ENCOUNTER — Other Ambulatory Visit: Payer: Self-pay

## 2019-03-16 ENCOUNTER — Ambulatory Visit (INDEPENDENT_AMBULATORY_CARE_PROVIDER_SITE_OTHER): Payer: Medicare HMO | Admitting: Gastroenterology

## 2019-03-16 ENCOUNTER — Encounter (INDEPENDENT_AMBULATORY_CARE_PROVIDER_SITE_OTHER): Payer: Self-pay | Admitting: Gastroenterology

## 2019-03-16 VITALS — BP 132/77 | HR 56 | Temp 97.5°F | Ht 71.0 in | Wt 233.1 lb

## 2019-03-16 DIAGNOSIS — K219 Gastro-esophageal reflux disease without esophagitis: Secondary | ICD-10-CM

## 2019-03-16 DIAGNOSIS — Z86010 Personal history of colonic polyps: Secondary | ICD-10-CM | POA: Diagnosis not present

## 2019-03-16 MED ORDER — PANTOPRAZOLE SODIUM 40 MG PO TBEC: 40.0000 mg | DELAYED_RELEASE_TABLET | Freq: Every day | ORAL | 3 refills | Status: DC

## 2019-03-16 NOTE — Patient Instructions (Signed)
Due for colonoscopy when able to schedule w/ COVID   Protonix refilled for one year - please call w/ any issues sooner

## 2019-03-16 NOTE — Progress Notes (Signed)
Patient profile: Reginald Stephens is a 69 y.o. male seen for evaluation of GERD.  History of Present Illness: Reginald Stephens is seen today for follow-up of GERD.  He reports he has been on Dexilant in the very distant past but this was expensive and has been on Protonix over the past year.  Protonix is working well for his symptoms.  As long as he does not eat late and lay flat immediately after eating he has no reflux symptoms. He previously had a lot of volume regurgitation waking him up at night before starting PPI daily.  He currently denies any dysphagia, nausea, vomiting, epigastric pain.  He is moving his stools daily, slightly looser consistency on Metformin 1000 mg twice daily but denies any diarrhea.  No abdominal pain, rectal bleeding, constipation.  He feels well today without complaints.  He stopped smoking 2 years ago.  He uses Aleve nightly for his ankle but does take this with food. No alcohol  Wt Readings from Last 3 Encounters:  03/16/19 233 lb 1.6 oz (105.7 kg)  03/19/18 235 lb (106.6 kg)  03/01/18 235 lb (106.6 kg)     Last Colonoscopy: 2012-Impression:  Examination performed to cecum.. Two small polyps ablated via cold biopsy from hepatic flexure and submitted in one container. Mild sigmoid colon diverticulosis.  One polyp was a tubular adenoma and the other one was hyperplastic. Next colonoscopy in 7 years.   Last Endoscopy: 2016-Non Critical schatzki's ring, Hiatal Hernia, Hiatus at 43cm, GE Junction at 40cm   Past Medical History:  Past Medical History:  Diagnosis Date  . Arthritis   . Asthma   . Chronic kidney disease   . Diabetes mellitus   . GERD (gastroesophageal reflux disease)   . History of kidney stones   . Hyperlipidemia   . Hypertension   . Palpitations     Problem List: Patient Active Problem List   Diagnosis Date Noted  . S/P ORIF (open reduction internal fixation) fracture left ankle 03/19/17 03/25/2017  . Closed bimalleolar fracture of  left ankle   . Palpitations 03/01/2015  . Essential hypertension 08/25/2012  . Hyperlipidemia 08/25/2012  . Diabetes ([place]) 08/25/2012    Past Surgical History: Past Surgical History:  Procedure Laterality Date  . COLONOSCOPY  02/15/2011   Procedure: COLONOSCOPY;  Surgeon: Rogene Houston, MD;  [facility];  Service: Endoscopy;  Laterality: N/A;  . CYSTOSCOPY  2011  . ESOPHAGEAL DILATION N/A 07/08/2014   Procedure: ESOPHAGEAL DILATION;  Surgeon: Rogene Houston, MD;  [facility];  Service: Endoscopy;  Laterality: N/A;  . ESOPHAGOGASTRODUODENOSCOPY N/A 07/08/2014   Procedure: ESOPHAGOGASTRODUODENOSCOPY (EGD);  Surgeon: Rogene Houston, MD;  [facility];  Service: Endoscopy;  Laterality: N/A;  245  . ORIF ANKLE FRACTURE Left 03/19/2017   Procedure: OPEN REDUCTION INTERNAL FIXATION (ORIF) LEFT ANKLE FRACTURE;  Surgeon: Carole Civil, MD;  Location: [facility];  Service: Orthopedics;  Laterality: Left;  . TONSILLECTOMY     age 38  . TONSILLECTOMY      Allergies: Allergies  Allergen Reactions  . Shellfish Allergy       Home Medications:  Current Outpatient Medications:  .  aspirin EC 81 MG tablet, Take 81 mg by mouth daily.  , Disp: , Rfl:  .  atorvastatin (LIPITOR) 20 MG tablet, Take 20 mg by mouth every morning.  , Disp: , Rfl:  .  Cholecalciferol (VITAMIN D3) 50000 units CAPS, TAKE ONE CAPSULE BY MOUTH  A WEEK, Disp: , Rfl: 11 .  diltiazem (CARTIA XT) 300 MG 24 hr capsule, Take 300 mg by mouth daily.  , Disp: , Rfl:  .  fluticasone (FLONASE) 50 MCG/ACT nasal spray, USE 2 SPRAYS EACH NOSTRILS DAILY, Disp: , Rfl: 1 .  lisinopril-hydrochlorothiazide (PRINZIDE,ZESTORETIC) 20-25 MG per tablet, Take 1 tablet by mouth daily. , Disp: , Rfl:  .  metFORMIN (GLUCOPHAGE) 1000 MG tablet, Take 1,000 mg by mouth 2 (two) times daily with a meal.  , Disp: , Rfl:  .  pantoprazole (PROTONIX) 40 MG tablet, Take 1 tablet (40 mg total) by mouth daily. Take 30 min before  breakfast, Disp: 90 tablet, Rfl: 3   Family History: family history includes Heart attack in his maternal grandmother; Stroke in his maternal grandfather.   Father passed from dementia, mother in 63s and only has back pain.  Denies family history of colon polyps or colon cancer   Social History:   reports that he quit smoking about 1 years ago. His smoking use included cigarettes. He has a 25.00 pack-year smoking history. He has never used smokeless tobacco. He reports current alcohol use of about 12.0 standard drinks of alcohol per week. He reports that he does not use drugs.   Review of Systems: Constitutional: Denies weight loss/weight gain  Eyes: No changes in vision. ENT: No oral lesions, sore throat.  GI: see HPI.  Heme/Lymph: No easy bruising.  CV: No chest pain.  GU: No hematuria.  Integumentary: No rashes.  Neuro: No headaches.  Psych: No depression/anxiety.  Endocrine: No heat/cold intolerance.  Allergic/Immunologic: No urticaria.  Resp: No cough, SOB.  Musculoskeletal: + Ankle pain following surgery 2 years ago   Physical Examination: BP 132/77 (BP Location: Right Arm, Patient Position: Sitting, Cuff Size: Large)   Pulse (!) 56   Temp (!) 97.5 F (36.4 C) (Temporal)   Ht 5\' 11"  (1.803 m)   Wt 233 lb 1.6 oz (105.7 kg)   BMI 32.51 kg/m  Gen: NAD, alert and oriented x 4 HEENT: PEERLA, EOMI, Neck: supple, no JVD Chest: CTA bilaterally, no wheezes, crackles, or other adventitious sounds CV: RRR, no m/g/c/r Abd: soft, NT, ND, +BS in all four quadrants; no HSM, guarding, ridigity, or rebound tenderness Ext: no edema, well perfused with 2+ pulses, Skin: no rash or lesions noted on observed skin Lymph: no noted LAD  Data Reviewed:   Requesting his recent labs from PCP-he reports A1c has been improved and has been able to come off one of his diabetic medications.  Assessment/Plan: Reginald Stephens is a 69 y.o. male    Reginald Stephens was seen today for follow-up.  Diagnoses  and all orders for this visit:  Gastroesophageal reflux disease without esophagitis -     pantoprazole (PROTONIX) 40 MG tablet; Take 1 tablet (40 mg total) by mouth daily. Take 30 min before breakfast    1.  GERD-will refill Protonix 40 mg daily, he is symptomatic without Protonix.  Diet modifications reviewed.  He will limit NSAIDs and take w/ food when needed.  He has no upper GI alarm symptoms and endoscopy 2016 without Barrett's. Continue PPI.   2.  History of colon polyps-reviewed he is overdue for colonoscopy but he would like to wait until Covid pandemic has improved prior to scheduling.  Reviewed alarm symptoms contact us with sooner.  He will call when he is ready to schedule.   Follow-up 1 year-sooner if needed or when ready to schedule colonoscopy.    I  personally performed the service, non-incident to. (WP)  Laurine Blazer, [facility]

## 2019-03-25 ENCOUNTER — Telehealth (INDEPENDENT_AMBULATORY_CARE_PROVIDER_SITE_OTHER): Payer: Self-pay | Admitting: Gastroenterology

## 2019-03-25 NOTE — Telephone Encounter (Signed)
Patient was called and a detailed message was left with Janice's recommendation.

## 2019-03-25 NOTE — Telephone Encounter (Signed)
Please notify patient his iron level was low when I got his labs from his PCP.  Please find out if PCP had him start an iron supplement, I would recommend this.  He can get this over-the-counter-ferrous sulfate 65 g daily.  Would consider adding on endoscopy to his colonoscopy to make sure he is not losing blood from his GI tract.  He wanted to call us when he was ready to schedule colonoscopy with Covid.

## 2019-03-30 DIAGNOSIS — H539 Unspecified visual disturbance: Secondary | ICD-10-CM | POA: Diagnosis not present

## 2019-03-30 DIAGNOSIS — E6609 Other obesity due to excess calories: Secondary | ICD-10-CM | POA: Diagnosis not present

## 2019-03-30 DIAGNOSIS — Z6833 Body mass index (BMI) 33.0-33.9, adult: Secondary | ICD-10-CM | POA: Diagnosis not present

## 2019-03-30 DIAGNOSIS — H109 Unspecified conjunctivitis: Secondary | ICD-10-CM | POA: Diagnosis not present

## 2019-04-01 DIAGNOSIS — H1789 Other corneal scars and opacities: Secondary | ICD-10-CM | POA: Diagnosis not present

## 2019-04-01 DIAGNOSIS — B0052 Herpesviral keratitis: Secondary | ICD-10-CM | POA: Diagnosis not present

## 2019-04-03 DIAGNOSIS — B0052 Herpesviral keratitis: Secondary | ICD-10-CM | POA: Diagnosis not present

## 2019-04-07 DIAGNOSIS — B0052 Herpesviral keratitis: Secondary | ICD-10-CM | POA: Diagnosis not present

## 2019-04-15 DIAGNOSIS — B0052 Herpesviral keratitis: Secondary | ICD-10-CM | POA: Diagnosis not present

## 2019-04-15 DIAGNOSIS — H40033 Anatomical narrow angle, bilateral: Secondary | ICD-10-CM | POA: Diagnosis not present

## 2019-04-15 DIAGNOSIS — H1789 Other corneal scars and opacities: Secondary | ICD-10-CM | POA: Diagnosis not present

## 2019-04-15 DIAGNOSIS — H25813 Combined forms of age-related cataract, bilateral: Secondary | ICD-10-CM | POA: Diagnosis not present

## 2019-04-26 ENCOUNTER — Ambulatory Visit: Payer: Medicare HMO | Attending: Internal Medicine

## 2019-04-26 DIAGNOSIS — Z23 Encounter for immunization: Secondary | ICD-10-CM

## 2019-04-26 NOTE — Progress Notes (Signed)
   Covid-19 Vaccination Clinic  Name:  GRANTHAM BIXLER    MRN: [id number] DOB: August 19, 1950  04/26/2019  Mr. Lemanski was observed post Covid-19 immunization for 15 minutes without incidence. He was provided with Vaccine Information Sheet and instruction to access the V-Safe system.   Mr. Joice was instructed to call 911 with any severe reactions post vaccine: Marland Kitchen Difficulty breathing  . Swelling of your face and throat  . A fast heartbeat  . A bad rash all over your body  . Dizziness and weakness    Immunizations Administered    Name Date Dose VIS Date Route   Pfizer COVID-19 Vaccine 04/26/2019 12:13 PM 0.3 mL 02/13/2019 Intramuscular   Manufacturer: [employer]   Lot: [id number]   [place]: [id number]

## 2019-05-20 ENCOUNTER — Ambulatory Visit: Payer: Medicare HMO | Attending: Internal Medicine

## 2019-05-20 DIAGNOSIS — Z23 Encounter for immunization: Secondary | ICD-10-CM

## 2019-05-20 NOTE — Progress Notes (Signed)
   Covid-19 Vaccination Clinic  Name:  Reginald Stephens    MRN: [id number] DOB: 1950/03/09  05/20/2019  Reginald Stephens was observed post Covid-19 immunization for 15 minutes without incident. He was provided with Vaccine Information Sheet and instruction to access the V-Safe system.   Reginald Stephens was instructed to call 911 with any severe reactions post vaccine: Marland Kitchen Difficulty breathing  . Swelling of face and throat  . A fast heartbeat  . A bad rash all over body  . Dizziness and weakness   Immunizations Administered    Name Date Dose VIS Date Route   Pfizer COVID-19 Vaccine 05/20/2019  8:41 AM 0.3 mL 02/13/2019 Intramuscular   Manufacturer: [employer]   Lot: [id number]   [place]: [id number]

## 2019-05-27 DIAGNOSIS — H40033 Anatomical narrow angle, bilateral: Secondary | ICD-10-CM | POA: Diagnosis not present

## 2019-05-27 DIAGNOSIS — H25813 Combined forms of age-related cataract, bilateral: Secondary | ICD-10-CM | POA: Diagnosis not present

## 2019-05-27 DIAGNOSIS — B0059 Other herpesviral disease of eye: Secondary | ICD-10-CM | POA: Diagnosis not present

## 2019-05-27 DIAGNOSIS — H1820 Unspecified corneal edema: Secondary | ICD-10-CM | POA: Diagnosis not present

## 2019-06-25 DIAGNOSIS — Z6833 Body mass index (BMI) 33.0-33.9, adult: Secondary | ICD-10-CM | POA: Diagnosis not present

## 2019-06-25 DIAGNOSIS — E119 Type 2 diabetes mellitus without complications: Secondary | ICD-10-CM | POA: Diagnosis not present

## 2019-06-25 DIAGNOSIS — E1129 Type 2 diabetes mellitus with other diabetic kidney complication: Secondary | ICD-10-CM | POA: Diagnosis not present

## 2019-06-25 DIAGNOSIS — L120 Bullous pemphigoid: Secondary | ICD-10-CM | POA: Diagnosis not present

## 2019-07-08 DIAGNOSIS — E119 Type 2 diabetes mellitus without complications: Secondary | ICD-10-CM | POA: Diagnosis not present

## 2019-07-08 DIAGNOSIS — B0052 Herpesviral keratitis: Secondary | ICD-10-CM | POA: Diagnosis not present

## 2019-07-09 DIAGNOSIS — R21 Rash and other nonspecific skin eruption: Secondary | ICD-10-CM | POA: Diagnosis not present

## 2019-07-10 DIAGNOSIS — Z125 Encounter for screening for malignant neoplasm of prostate: Secondary | ICD-10-CM | POA: Diagnosis not present

## 2019-07-10 DIAGNOSIS — E119 Type 2 diabetes mellitus without complications: Secondary | ICD-10-CM | POA: Diagnosis not present

## 2019-07-10 DIAGNOSIS — E1129 Type 2 diabetes mellitus with other diabetic kidney complication: Secondary | ICD-10-CM | POA: Diagnosis not present

## 2019-07-10 DIAGNOSIS — Z1389 Encounter for screening for other disorder: Secondary | ICD-10-CM | POA: Diagnosis not present

## 2019-07-10 DIAGNOSIS — Z6833 Body mass index (BMI) 33.0-33.9, adult: Secondary | ICD-10-CM | POA: Diagnosis not present

## 2019-07-10 DIAGNOSIS — E6609 Other obesity due to excess calories: Secondary | ICD-10-CM | POA: Diagnosis not present

## 2019-07-10 DIAGNOSIS — E7849 Other hyperlipidemia: Secondary | ICD-10-CM | POA: Diagnosis not present

## 2019-07-10 DIAGNOSIS — I10 Essential (primary) hypertension: Secondary | ICD-10-CM | POA: Diagnosis not present

## 2019-07-10 DIAGNOSIS — Z0000 Encounter for general adult medical examination without abnormal findings: Secondary | ICD-10-CM | POA: Diagnosis not present

## 2019-08-12 ENCOUNTER — Telehealth (INDEPENDENT_AMBULATORY_CARE_PROVIDER_SITE_OTHER): Payer: Self-pay | Admitting: Gastroenterology

## 2019-08-12 NOTE — Telephone Encounter (Signed)
Patient called wanted to schedule a colonoscopy - stated he was to call office to get scheduled - does he need an office visit since it has been since January when he was last seen - please advise

## 2019-08-12 NOTE — Telephone Encounter (Signed)
Chart reviewed-colonoscopy was recommended for history of colon polyps and he wanted to wait till the pandemic was improved.  I am okay if he would like to schedule this time without an office visit as long as he has not had any changes in his health history or started new medications such as blood thinners. Thanks!   Ann-he is on metformin

## 2019-08-13 NOTE — Telephone Encounter (Signed)
Left message for patient to call me to schedule.

## 2019-08-26 ENCOUNTER — Other Ambulatory Visit (INDEPENDENT_AMBULATORY_CARE_PROVIDER_SITE_OTHER): Payer: Self-pay | Admitting: *Deleted

## 2019-08-26 ENCOUNTER — Encounter (INDEPENDENT_AMBULATORY_CARE_PROVIDER_SITE_OTHER): Payer: Self-pay | Admitting: *Deleted

## 2019-08-26 ENCOUNTER — Telehealth (INDEPENDENT_AMBULATORY_CARE_PROVIDER_SITE_OTHER): Payer: Self-pay | Admitting: *Deleted

## 2019-08-26 DIAGNOSIS — Z86010 Personal history of colonic polyps: Secondary | ICD-10-CM

## 2019-08-26 NOTE — Telephone Encounter (Signed)
Patient needs Plenvu (copay card) ° °

## 2019-08-28 MED ORDER — PLENVU 140 G PO SOLR: 1.0000 | Freq: Once | ORAL | 0 refills | Status: AC

## 2019-08-31 DIAGNOSIS — H1820 Unspecified corneal edema: Secondary | ICD-10-CM | POA: Diagnosis not present

## 2019-08-31 DIAGNOSIS — H2513 Age-related nuclear cataract, bilateral: Secondary | ICD-10-CM | POA: Diagnosis not present

## 2019-08-31 DIAGNOSIS — B0052 Herpesviral keratitis: Secondary | ICD-10-CM | POA: Diagnosis not present

## 2019-08-31 DIAGNOSIS — B0059 Other herpesviral disease of eye: Secondary | ICD-10-CM | POA: Diagnosis not present

## 2019-09-21 ENCOUNTER — Other Ambulatory Visit: Payer: Self-pay

## 2019-09-21 ENCOUNTER — Other Ambulatory Visit (HOSPITAL_COMMUNITY)
Admission: RE | Admit: 2019-09-21 | Discharge: 2019-09-21 | Disposition: A | Discharge status: Home / Self Care | Payer: Medicare HMO | Source: Ambulatory Visit | Attending: Internal Medicine | Admitting: Internal Medicine

## 2019-09-21 DIAGNOSIS — Z87442 Personal history of urinary calculi: Secondary | ICD-10-CM | POA: Diagnosis not present

## 2019-09-21 DIAGNOSIS — K219 Gastro-esophageal reflux disease without esophagitis: Secondary | ICD-10-CM | POA: Diagnosis not present

## 2019-09-21 DIAGNOSIS — I129 Hypertensive chronic kidney disease with stage 1 through stage 4 chronic kidney disease, or unspecified chronic kidney disease: Secondary | ICD-10-CM | POA: Diagnosis not present

## 2019-09-21 DIAGNOSIS — J45909 Unspecified asthma, uncomplicated: Secondary | ICD-10-CM | POA: Diagnosis not present

## 2019-09-21 DIAGNOSIS — Z7982 Long term (current) use of aspirin: Secondary | ICD-10-CM | POA: Diagnosis not present

## 2019-09-21 DIAGNOSIS — K648 Other hemorrhoids: Secondary | ICD-10-CM | POA: Diagnosis not present

## 2019-09-21 DIAGNOSIS — Z823 Family history of stroke: Secondary | ICD-10-CM | POA: Diagnosis not present

## 2019-09-21 DIAGNOSIS — Z01812 Encounter for preprocedural laboratory examination: Secondary | ICD-10-CM | POA: Insufficient documentation

## 2019-09-21 DIAGNOSIS — Z79899 Other long term (current) drug therapy: Secondary | ICD-10-CM | POA: Diagnosis not present

## 2019-09-21 DIAGNOSIS — K5730 Diverticulosis of large intestine without perforation or abscess without bleeding: Secondary | ICD-10-CM | POA: Diagnosis not present

## 2019-09-21 DIAGNOSIS — E785 Hyperlipidemia, unspecified: Secondary | ICD-10-CM | POA: Diagnosis not present

## 2019-09-21 DIAGNOSIS — E1122 Type 2 diabetes mellitus with diabetic chronic kidney disease: Secondary | ICD-10-CM | POA: Diagnosis not present

## 2019-09-21 DIAGNOSIS — N189 Chronic kidney disease, unspecified: Secondary | ICD-10-CM | POA: Diagnosis not present

## 2019-09-21 DIAGNOSIS — Z20822 Contact with and (suspected) exposure to covid-19: Secondary | ICD-10-CM | POA: Insufficient documentation

## 2019-09-21 DIAGNOSIS — R002 Palpitations: Secondary | ICD-10-CM | POA: Diagnosis not present

## 2019-09-21 DIAGNOSIS — Z86010 Personal history of colonic polyps: Secondary | ICD-10-CM | POA: Diagnosis not present

## 2019-09-21 DIAGNOSIS — Z91013 Allergy to seafood: Secondary | ICD-10-CM | POA: Diagnosis not present

## 2019-09-21 DIAGNOSIS — Z87891 Personal history of nicotine dependence: Secondary | ICD-10-CM | POA: Diagnosis not present

## 2019-09-21 DIAGNOSIS — Z1211 Encounter for screening for malignant neoplasm of colon: Secondary | ICD-10-CM | POA: Diagnosis present

## 2019-09-21 DIAGNOSIS — Z7984 Long term (current) use of oral hypoglycemic drugs: Secondary | ICD-10-CM | POA: Diagnosis not present

## 2019-09-21 DIAGNOSIS — M1990 Unspecified osteoarthritis, unspecified site: Secondary | ICD-10-CM | POA: Diagnosis not present

## 2019-09-21 DIAGNOSIS — Z8249 Family history of ischemic heart disease and other diseases of the circulatory system: Secondary | ICD-10-CM | POA: Diagnosis not present

## 2019-09-22 LAB — SARS CORONAVIRUS 2 (TAT 6-24 HRS): SARS Coronavirus 2: NEGATIVE

## 2019-09-23 ENCOUNTER — Encounter (HOSPITAL_COMMUNITY): Payer: Self-pay | Admitting: Internal Medicine

## 2019-09-23 ENCOUNTER — Other Ambulatory Visit: Payer: Self-pay

## 2019-09-23 ENCOUNTER — Encounter (HOSPITAL_COMMUNITY): Payer: Self-pay | Admitting: Anesthesiology

## 2019-09-23 ENCOUNTER — Ambulatory Visit (HOSPITAL_COMMUNITY)
Admission: RE | Admit: 2019-09-23 | Discharge: 2019-09-23 | Disposition: A | Discharge status: Home / Self Care | Payer: Medicare HMO | Attending: Internal Medicine | Admitting: Internal Medicine

## 2019-09-23 ENCOUNTER — Encounter (HOSPITAL_COMMUNITY)
Admission: RE | Disposition: A | Discharge status: Home / Self Care | Payer: Self-pay | Source: Home / Self Care | Attending: Internal Medicine

## 2019-09-23 DIAGNOSIS — Z8249 Family history of ischemic heart disease and other diseases of the circulatory system: Secondary | ICD-10-CM | POA: Insufficient documentation

## 2019-09-23 DIAGNOSIS — K648 Other hemorrhoids: Secondary | ICD-10-CM | POA: Insufficient documentation

## 2019-09-23 DIAGNOSIS — K5730 Diverticulosis of large intestine without perforation or abscess without bleeding: Secondary | ICD-10-CM | POA: Diagnosis not present

## 2019-09-23 DIAGNOSIS — Z823 Family history of stroke: Secondary | ICD-10-CM | POA: Insufficient documentation

## 2019-09-23 DIAGNOSIS — I129 Hypertensive chronic kidney disease with stage 1 through stage 4 chronic kidney disease, or unspecified chronic kidney disease: Secondary | ICD-10-CM | POA: Insufficient documentation

## 2019-09-23 DIAGNOSIS — Z7984 Long term (current) use of oral hypoglycemic drugs: Secondary | ICD-10-CM | POA: Insufficient documentation

## 2019-09-23 DIAGNOSIS — E785 Hyperlipidemia, unspecified: Secondary | ICD-10-CM | POA: Diagnosis not present

## 2019-09-23 DIAGNOSIS — Z09 Encounter for follow-up examination after completed treatment for conditions other than malignant neoplasm: Secondary | ICD-10-CM | POA: Diagnosis not present

## 2019-09-23 DIAGNOSIS — Z87891 Personal history of nicotine dependence: Secondary | ICD-10-CM | POA: Insufficient documentation

## 2019-09-23 DIAGNOSIS — E1122 Type 2 diabetes mellitus with diabetic chronic kidney disease: Secondary | ICD-10-CM | POA: Insufficient documentation

## 2019-09-23 DIAGNOSIS — J45909 Unspecified asthma, uncomplicated: Secondary | ICD-10-CM | POA: Insufficient documentation

## 2019-09-23 DIAGNOSIS — Z87442 Personal history of urinary calculi: Secondary | ICD-10-CM | POA: Insufficient documentation

## 2019-09-23 DIAGNOSIS — N189 Chronic kidney disease, unspecified: Secondary | ICD-10-CM | POA: Diagnosis not present

## 2019-09-23 DIAGNOSIS — Z86010 Personal history of colonic polyps: Secondary | ICD-10-CM | POA: Insufficient documentation

## 2019-09-23 DIAGNOSIS — Z7982 Long term (current) use of aspirin: Secondary | ICD-10-CM | POA: Insufficient documentation

## 2019-09-23 DIAGNOSIS — Z1211 Encounter for screening for malignant neoplasm of colon: Secondary | ICD-10-CM | POA: Diagnosis not present

## 2019-09-23 DIAGNOSIS — M1990 Unspecified osteoarthritis, unspecified site: Secondary | ICD-10-CM | POA: Insufficient documentation

## 2019-09-23 DIAGNOSIS — Z79899 Other long term (current) drug therapy: Secondary | ICD-10-CM | POA: Insufficient documentation

## 2019-09-23 DIAGNOSIS — R002 Palpitations: Secondary | ICD-10-CM | POA: Insufficient documentation

## 2019-09-23 DIAGNOSIS — K219 Gastro-esophageal reflux disease without esophagitis: Secondary | ICD-10-CM | POA: Insufficient documentation

## 2019-09-23 DIAGNOSIS — Z91013 Allergy to seafood: Secondary | ICD-10-CM | POA: Insufficient documentation

## 2019-09-23 HISTORY — PX: COLONOSCOPY: SHX5424

## 2019-09-23 LAB — GLUCOSE, CAPILLARY: Glucose-Capillary: 143 mg/dL — ABNORMAL HIGH (ref 70–99)

## 2019-09-23 SURGERY — COLONOSCOPY
Anesthesia: Moderate Sedation

## 2019-09-23 MED ORDER — ATROPINE SULFATE 1 MG/ML IJ SOLN
INTRAMUSCULAR | Status: DC | PRN
  Administered 2019-09-23: .3 mg via INTRAVENOUS

## 2019-09-23 MED ORDER — MEPERIDINE HCL 50 MG/ML IJ SOLN
INTRAMUSCULAR | Status: AC
  Filled 2019-09-23: qty 1

## 2019-09-23 MED ORDER — SODIUM CHLORIDE 0.9 % IV SOLN: INTRAVENOUS | Status: DC

## 2019-09-23 MED ORDER — MIDAZOLAM HCL 5 MG/5ML IJ SOLN
INTRAMUSCULAR | Status: AC
  Filled 2019-09-23: qty 10

## 2019-09-23 MED ORDER — STERILE WATER FOR IRRIGATION IR SOLN
Status: DC | PRN
  Administered 2019-09-23: 2.5 mL

## 2019-09-23 MED ORDER — MEPERIDINE HCL 50 MG/ML IJ SOLN
INTRAMUSCULAR | Status: DC | PRN
  Administered 2019-09-23 (×2): 25 mg via INTRAVENOUS

## 2019-09-23 MED ORDER — MIDAZOLAM HCL 5 MG/5ML IJ SOLN
INTRAMUSCULAR | Status: DC | PRN
  Administered 2019-09-23 (×2): 1 mg via INTRAVENOUS
  Administered 2019-09-23 (×2): 2 mg via INTRAVENOUS

## 2019-09-23 MED ORDER — ATROPINE SULFATE 1 MG/ML IJ SOLN
INTRAMUSCULAR | Status: AC
  Filled 2019-09-23: qty 1

## 2019-09-23 NOTE — Op Note (Signed)
[facility] Patient Name: Reginald Stephens Procedure Date: 09/23/2019 7:23 AM MRN: [id number] Date of Birth: Jan 21, 1951 Attending MD: Hildred Laser , MD CSN: [id number] Age: 69 Admit Type: Outpatient Procedure:                Colonoscopy Indications:              High risk colon cancer surveillance: Personal                            history of colonic polyps Providers:                Hildred Laser, MD, Lurline Del, RN, Charlsie Quest.                            Theda Sers RN, RN, Crystal Page, Randa Spike,                            Technician Referring MD:             Collene Mares, PA Medicines:                Meperidine 50 mg IV, Midazolam 6 mg IV, Atropine                            0.3 mg IV Complications:            No immediate complications. Estimated Blood Loss:     Estimated blood loss: none. Procedure:                Pre-Anesthesia Assessment:                           - Prior to the procedure, a History and Physical                            was performed, and patient medications and                            allergies were reviewed. The patient's tolerance of                            previous anesthesia was also reviewed. The risks                            and benefits of the procedure and the sedation                            options and risks were discussed with the patient.                            All questions were answered, and informed consent                            was obtained. Prior Anticoagulants: The patient has  taken no previous anticoagulant or antiplatelet                            agents except for aspirin. ASA Grade Assessment: II                            - A patient with mild systemic disease. After                            reviewing the risks and benefits, the patient was                            deemed in satisfactory condition to undergo the                            procedure.                            After obtaining informed consent, the colonoscope                            was passed under direct vision. Throughout the                            procedure, the patient's blood pressure, pulse, and                            oxygen saturations were monitored continuously. The                            [id number]-[id number] ([id number]) scope was introduced through                            the anus and advanced to the the cecum, identified                            by appendiceal orifice and ileocecal valve. The                            colonoscopy was somewhat difficult due to a                            redundant colon. Successful completion of the                            procedure was aided by changing the patient to a                            supine position, using manual pressure and scope                            guide. The patient tolerated the procedure well.  The quality of the bowel preparation was good. Scope In: 7:44:11 AM Scope Out: 8:07:55 AM Scope Withdrawal Time: 0 hours 7 minutes 38 seconds  Total Procedure Duration: 0 hours 23 minutes 44 seconds  Findings:      The perianal and digital rectal examinations were normal.      Two diverticula were found in the splenic flexure.      The exam was otherwise normal throughout the examined colon.      The retroflexed view of the distal rectum and anal verge revealed small       hemorrhoids above dentate line. Impression:               - Diverticulosis at the splenic flexure.                           - Small internal hemorrhoids.                           - No specimens collected. Moderate Sedation:      Moderate (conscious) sedation was administered by the endoscopy nurse       and supervised by the endoscopist. The following parameters were       monitored: oxygen saturation, heart rate, blood pressure, CO2       capnography and response to care. Total physician intraservice time was       30  minutes. Recommendation:           - Patient has a contact number available for                            emergencies. The signs and symptoms of potential                            delayed complications were discussed with the                            patient. Return to normal activities tomorrow.                            Written discharge instructions were provided to the                            patient.                           - High fiber diet and diabetic (ADA) diet today.                           - Continue present medications including Aspirin at                            prior dose.                           - Repeat colonoscopy in 10 years. Procedure Code(s):        --- Professional ---  [phone], Colonoscopy, flexible; diagnostic, including                            collection of specimen(s) by brushing or washing,                            when performed (separate procedure)                           99153, Moderate sedation; each additional 15                            minutes intraservice time                           G0500, Moderate sedation services provided by the                            same physician or other qualified health care                            professional performing a gastrointestinal                            endoscopic service that sedation supports,                            requiring the presence of an independent trained                            observer to assist in the monitoring of the                            patient's level of consciousness and physiological                            status; initial 15 minutes of intra-service time;                            patient age 90 years or older (additional time may                            be reported with [phone], as appropriate) Diagnosis Code(s):        --- Professional ---                           Z86.010, Personal history of colonic polyps                            K57.30, Diverticulosis of large intestine without                            perforation or abscess without bleeding CPT copyright 2019 American Medical Association. All rights reserved. The codes documented in this report are preliminary and upon coder review may  be  revised to meet current compliance requirements. Hildred Laser, MD Hildred Laser, MD 09/23/2019 8:20:22 AM This report has been signed electronically. Number of Addenda: 0

## 2019-09-23 NOTE — H&P (Addendum)
Reginald Stephens is an 69 y.o. male.   Chief Complaint: Patient is here for colonoscopy. HPI: Patient is 69 year old African-American male who is here for surveillance colonoscopy.  He had 2 small polyps removed in December 2012.  1 polyp was tubular adenoma.  He was advised to return in 7 years.  He has no GI complaints.  He denies abdominal pain melena or rectal bleeding.  His appetite is good. Last aspirin dose was 4 days ago. Family history is negative for CRC.  Past Medical History:  Diagnosis Date  . Arthritis   . Asthma   . Chronic kidney disease   . Diabetes mellitus   . GERD (gastroesophageal reflux disease)   . History of kidney stones   . Hyperlipidemia   . Hypertension   . Palpitations     Past Surgical History:  Procedure Laterality Date  . COLONOSCOPY  02/15/2011   Procedure: COLONOSCOPY;  Surgeon: Rogene Houston, MD;  [facility];  Service: Endoscopy;  Laterality: N/A;  . CYSTOSCOPY  2011  . ESOPHAGEAL DILATION N/A 07/08/2014   Procedure: ESOPHAGEAL DILATION;  Surgeon: Rogene Houston, MD;  [facility];  Service: Endoscopy;  Laterality: N/A;  . ESOPHAGOGASTRODUODENOSCOPY N/A 07/08/2014   Procedure: ESOPHAGOGASTRODUODENOSCOPY (EGD);  Surgeon: Rogene Houston, MD;  [facility];  Service: Endoscopy;  Laterality: N/A;  245  . ORIF ANKLE FRACTURE Left 03/19/2017   Procedure: OPEN REDUCTION INTERNAL FIXATION (ORIF) LEFT ANKLE FRACTURE;  Surgeon: Carole Civil, MD;  Location: [facility];  Service: Orthopedics;  Laterality: Left;  . TONSILLECTOMY     age 24  . TONSILLECTOMY      Family History  Problem Relation Age of Onset  . Heart attack Maternal Grandmother   . Stroke Maternal Grandfather    Social History:  reports that he quit smoking about 2 years ago. His smoking use included cigarettes. He has a 25.00 pack-year smoking history. He has never used smokeless tobacco. He reports current alcohol use of about 12.0 standard drinks of  alcohol per week. He reports that he does not use drugs.  Allergies:  Allergies  Allergen Reactions  . Shellfish Allergy Nausea And Vomiting    Medications Prior to Admission  Medication Sig Dispense Refill  . aspirin EC 81 MG tablet Take 81 mg by mouth daily.      Marland Kitchen atorvastatin (LIPITOR) 20 MG tablet Take 20 mg by mouth daily.     . Cholecalciferol (VITAMIN D3) 50000 units CAPS Take 50,000 Units by mouth once a week. Friday  11  . diltiazem (CARTIA XT) 300 MG 24 hr capsule Take 300 mg by mouth daily.      . ferrous sulfate 325 (65 FE) MG tablet Take 325 mg by mouth daily with breakfast.    . fluticasone (FLONASE) 50 MCG/ACT nasal spray Place 1 spray into both nostrils daily.   1  . lisinopril-hydrochlorothiazide (PRINZIDE,ZESTORETIC) 20-25 MG per tablet Take 1 tablet by mouth daily.     . metFORMIN (GLUCOPHAGE) 1000 MG tablet Take 1,000 mg by mouth 2 (two) times daily with a meal.      . pantoprazole (PROTONIX) 40 MG tablet Take 1 tablet (40 mg total) by mouth daily. Take 30 min before breakfast 90 tablet 3    Results for orders placed or performed during the hospital encounter of 09/23/19 (from the past 48 hour(s))  Glucose, capillary     Status: Abnormal   Collection Time: 09/23/19  6:49 AM  Result Value Ref Range   Glucose-Capillary 143 (H) 70 - 99 mg/dL    Comment: Glucose reference range applies only to samples taken after fasting for at least 8 hours.   No results found.  Review of Systems  Blood pressure (!) 141/73, pulse (!) 49, temperature 97.6 F (36.4 C), temperature source Oral, resp. rate 15, height 5\' 11"  (1.803 m), weight 106.6 kg, SpO2 99 %. Physical Exam HENT:     Mouth/Throat:     Mouth: Mucous membranes are moist.     Pharynx: Oropharynx is clear.  Eyes:     General: No scleral icterus.    Conjunctiva/sclera: Conjunctivae normal.  Cardiovascular:     Rate and Rhythm: Normal rate and regular rhythm.     Heart sounds: No murmur heard.   Pulmonary:      Effort: Pulmonary effort is normal.     Breath sounds: Normal breath sounds.  Abdominal:     General: There is no distension.     Palpations: Abdomen is soft. There is no mass.     Tenderness: There is no abdominal tenderness.  Musculoskeletal:        General: Swelling present.     Cervical back: Normal range of motion and neck supple.     Comments: Edema around left ankle.  Skin:    General: Skin is warm and dry.  Neurological:     Mental Status: He is alert.      Assessment/Plan History of colonic adenoma. Surveillance colonoscopy.  Hildred Laser, MD 09/23/2019, 7:32 AM

## 2019-09-23 NOTE — Discharge Instructions (Signed)
Resume usual medications including aspirin as before. Modified carb high-fiber diet. No driving for 24 hours. Can wait 10 years before next colonoscopy.   Colonoscopy, Adult, Care After This sheet gives you information about how to care for yourself after your procedure. Your health care provider may also give you more specific instructions. If you have problems or questions, contact your health care provider.  Dr. Laural Golden:  [phone] What can I expect after the procedure? After the procedure, it is common to have:  A small amount of blood in your stool for 24 hours after the procedure.  Some gas.  Mild cramping or bloating of your abdomen. Follow these instructions at home: Eating and drinking   Drink enough fluid to keep your urine pale yellow.  Resume your normal diet. Activity  Rest as told by your health care provider.   Managing cramping and bloating   Try walking around when you have cramps or feel bloated.  General instructions  For the first 24 hours after the procedure: ? Do not drive or use machinery. ? Do not sign important documents. ? Do not drink alcohol. ? Do your regular daily activities at a slower pace than normal. ? Eat soft foods that are easy to digest.  Take over-the-counter and prescription medicines only as told by your health care provider.  Keep all follow-up visits as told by your health care provider. This is important. Contact a health care provider if:  You have blood in your stool 2-3 days after the procedure. Get help right away if you have:  More than a small spotting of blood in your stool.  Large blood clots in your stool.  Swelling of your abdomen.  Nausea or vomiting.  A fever.  Increasing pain in your abdomen that is not relieved with medicine. Summary  After the procedure, it is common to have a small amount of blood in your stool. You may also have mild cramping and bloating of your abdomen.  For the first 24  hours after the procedure, do not drive or use machinery, sign important documents, or drink alcohol.  Get help right away if you have a lot of blood in your stool, nausea or vomiting, a fever, or increased pain in your abdomen. This information is not intended to replace advice given to you by your health care provider. Make sure you discuss any questions you have with your health care provider. Document Revised: 09/15/2018 Document Reviewed: 09/15/2018 Elsevier Patient Education  [place].

## 2019-09-28 ENCOUNTER — Encounter (HOSPITAL_COMMUNITY): Payer: Self-pay | Admitting: Internal Medicine

## 2019-10-02 IMAGING — DX DG FOOT COMPLETE 3+V*L*
3 series · 3 of 3 positions shown · non-contrast
Comparison: Same day ankle radiographs.

CLINICAL DATA: Pain after fall from ladder.

EXAM:
LEFT FOOT - COMPLETE 3+ VIEW

[foot ap]
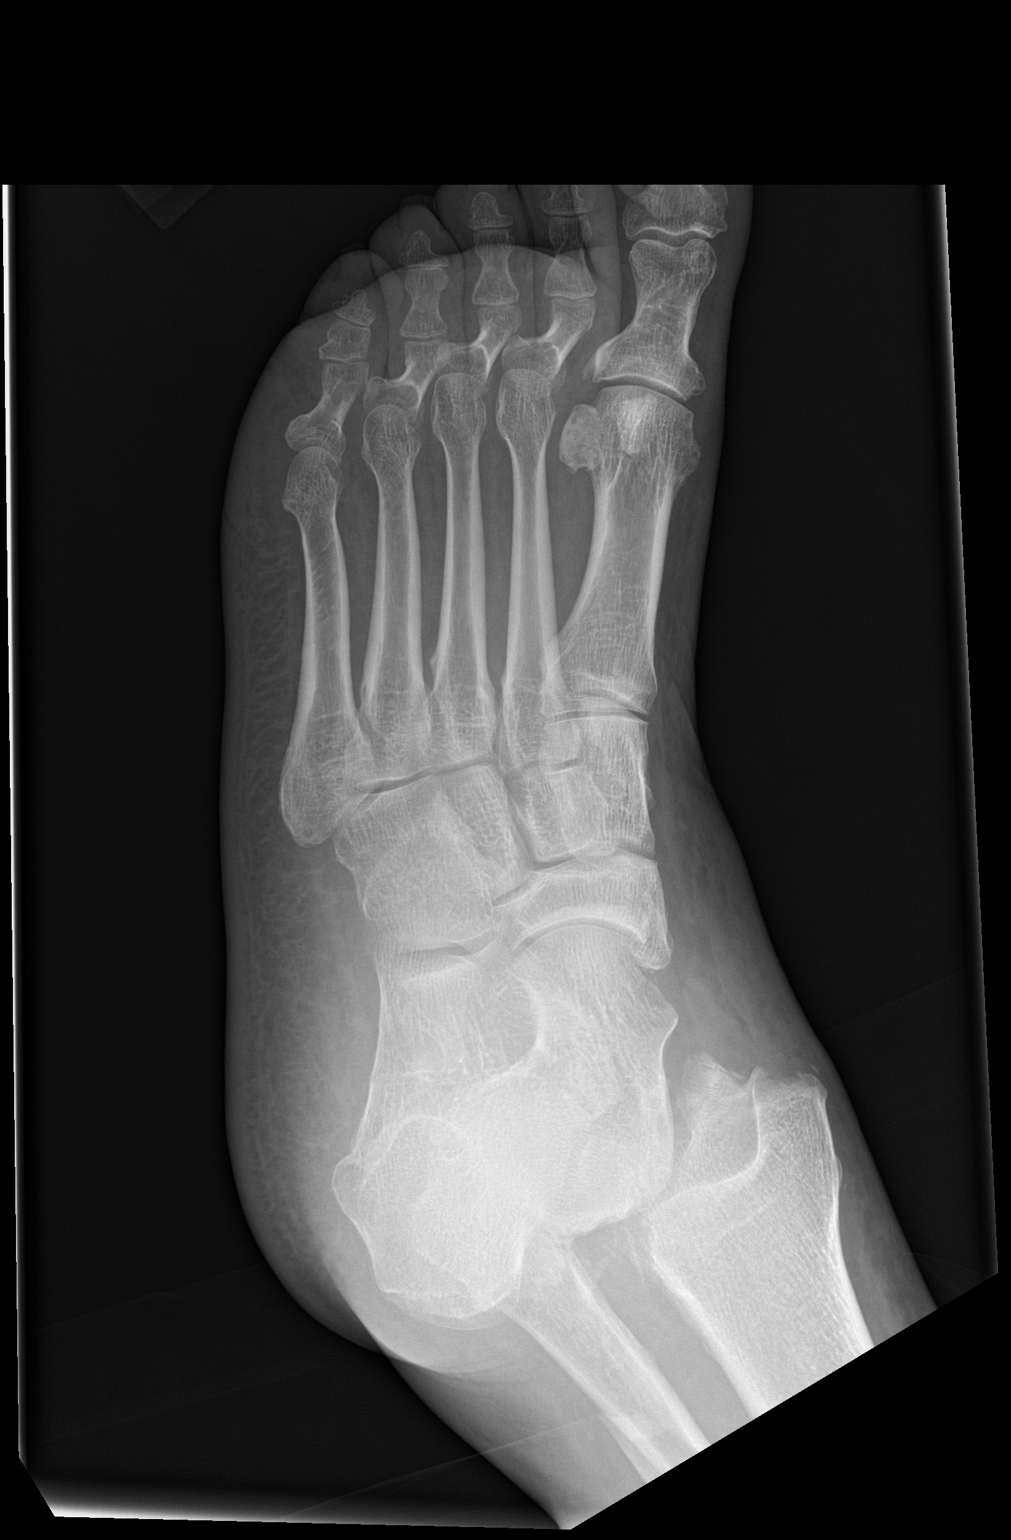

[foot obl]
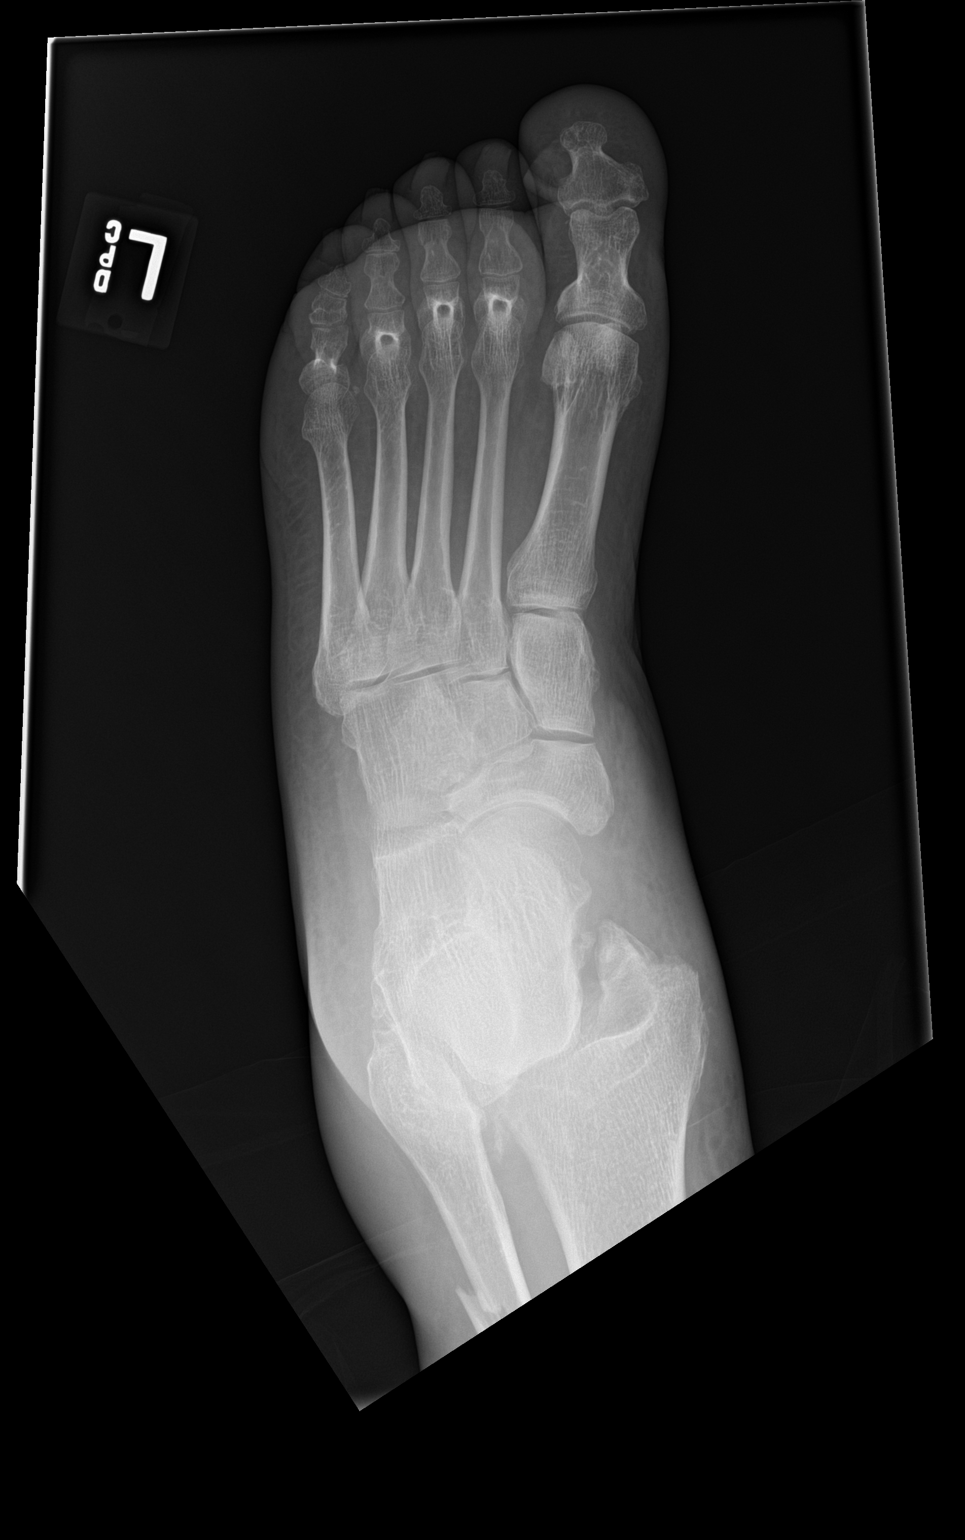

[foot lat]
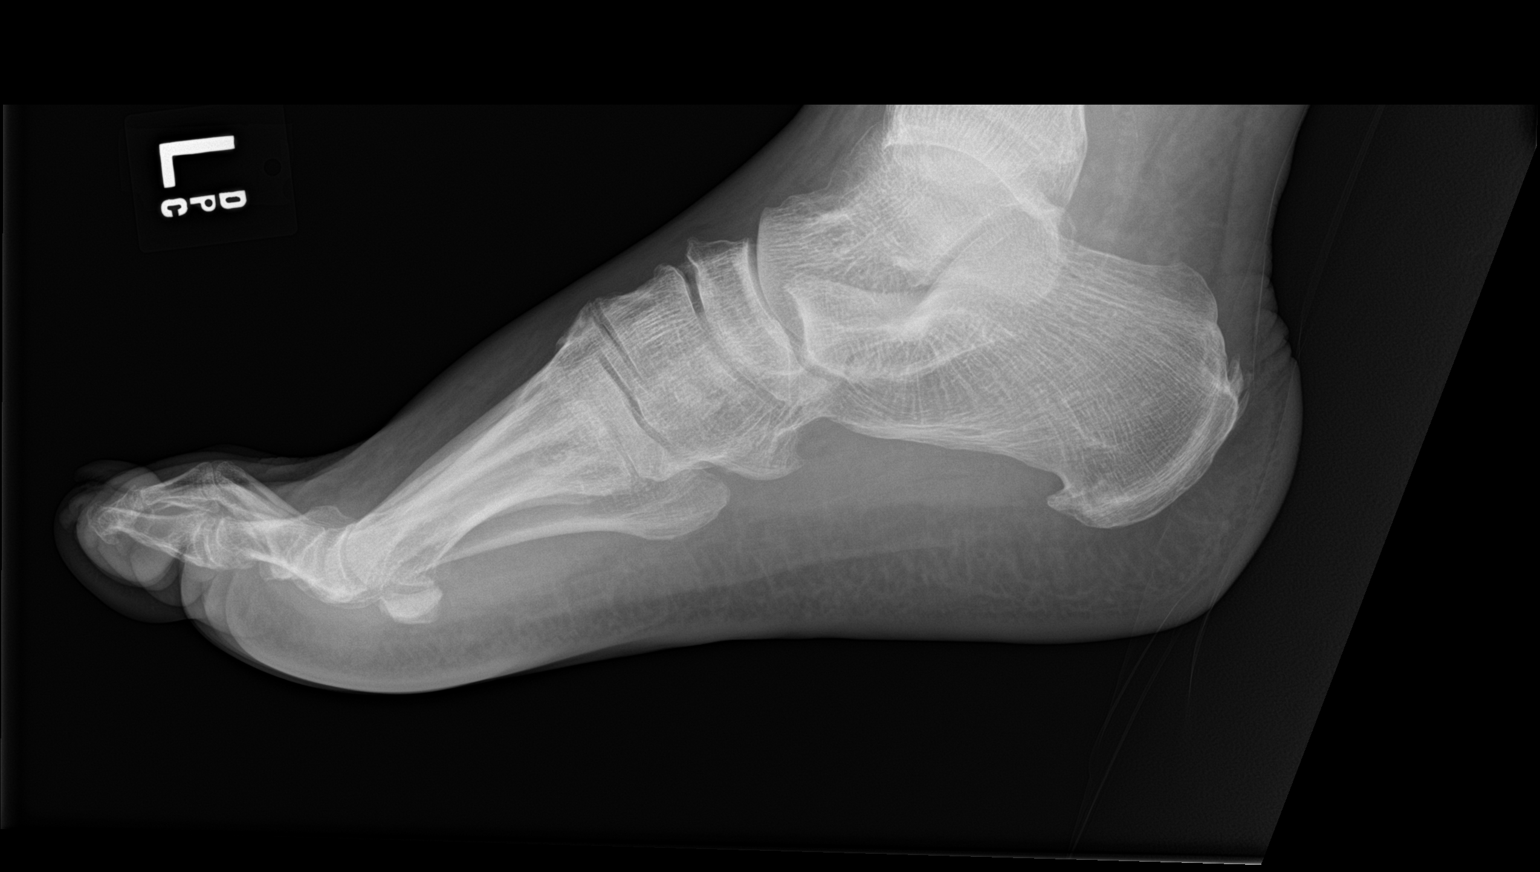

[3 of 3 positions shown; findings below may reference images not displayed]

FINDINGS: Please see the ankle report for details of an acute
fracture-subluxation of the ankle involving the distal diaphysis of
the fibula, medial malleolus and posterolateral corner of the tibial
epiphysis with lateral subluxation of the talar dome relative to the
tibial plafond. The calcaneus appears intact. There are calcaneal
enthesophytes along the plantar dorsal aspect. The midfoot
articulations are maintained. No acute osseous abnormality of the
foot. Periarticular soft tissue swelling is seen about the ankle.
IMPRESSION: 1. No acute osseous abnormality of the foot is identified.
2. Acute fracture-subluxation of the ankle joint described in the
ankle report.

## 2019-10-03 IMAGING — CR DG ANKLE PORT 2V*L*
3 series · 3 of 3 positions shown · non-contrast
Comparison: None.

CLINICAL DATA: Left ankle fracture post casting.

EXAM:
PORTABLE LEFT ANKLE - 2 VIEW

[oblique]
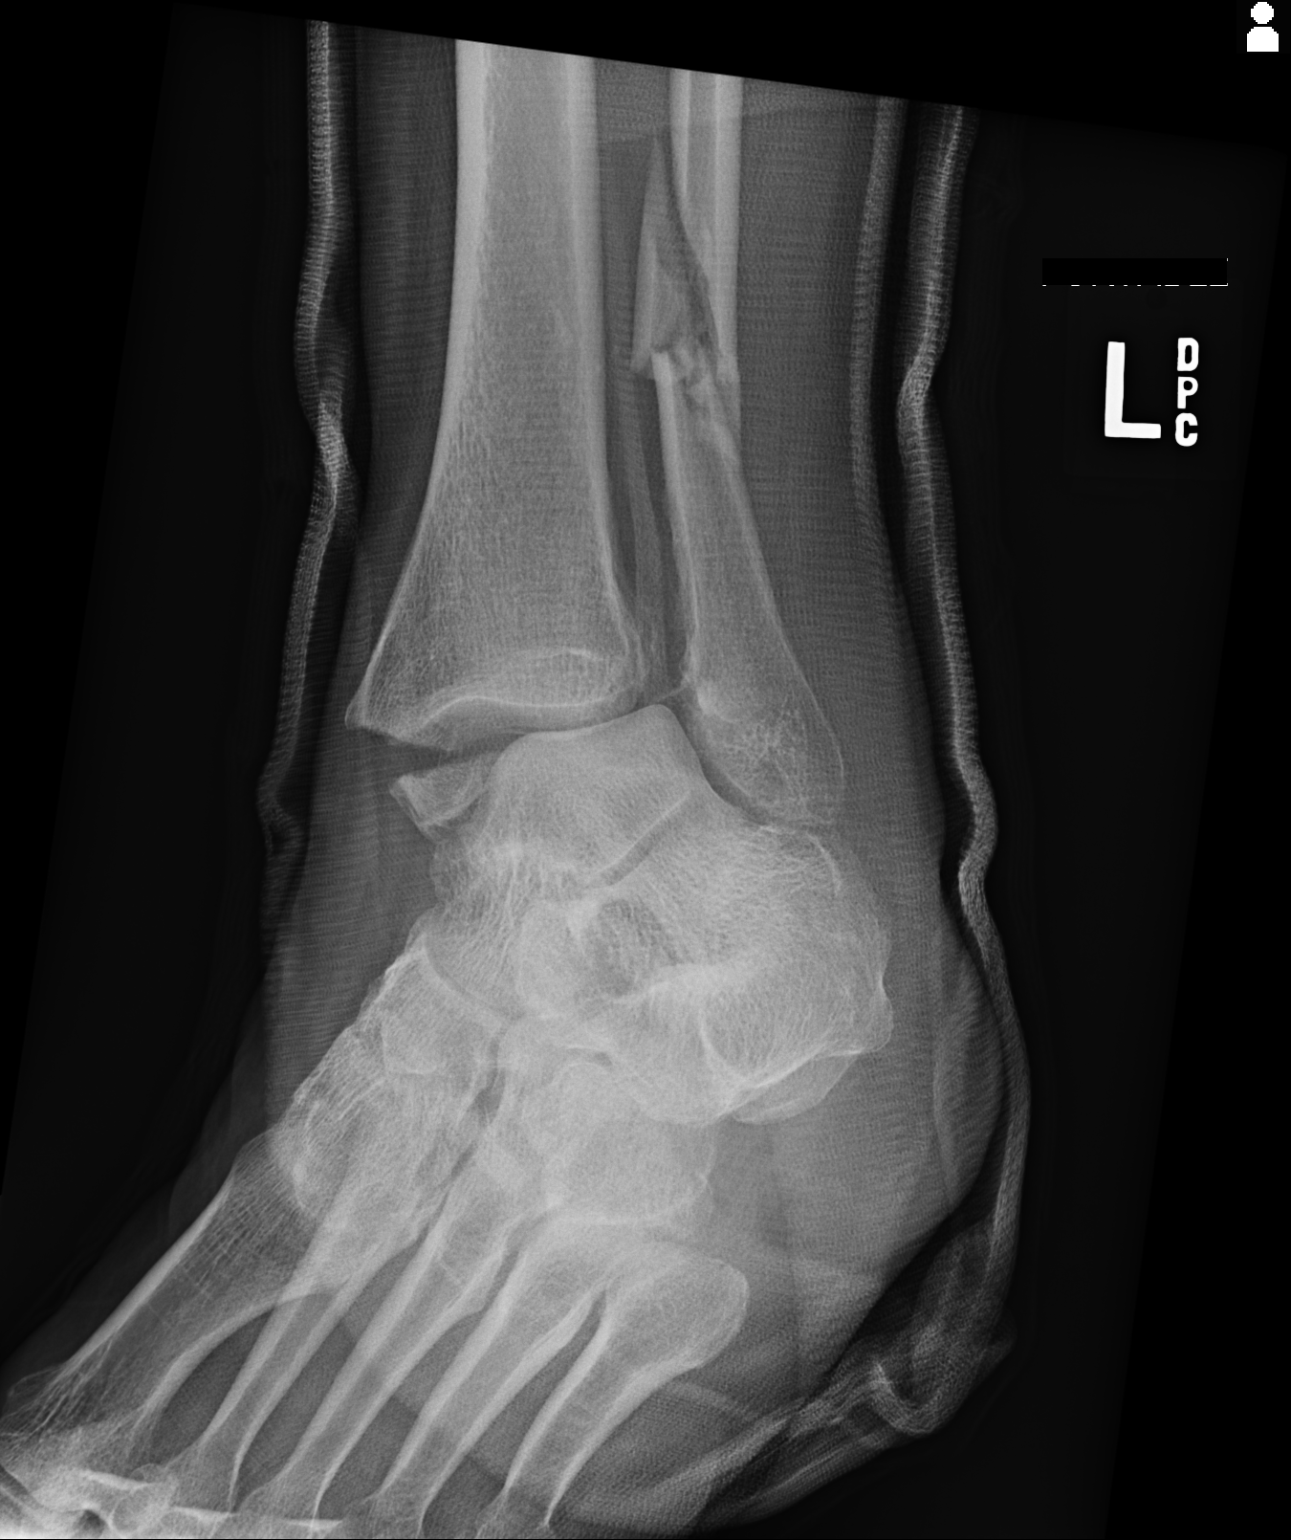

[lat]
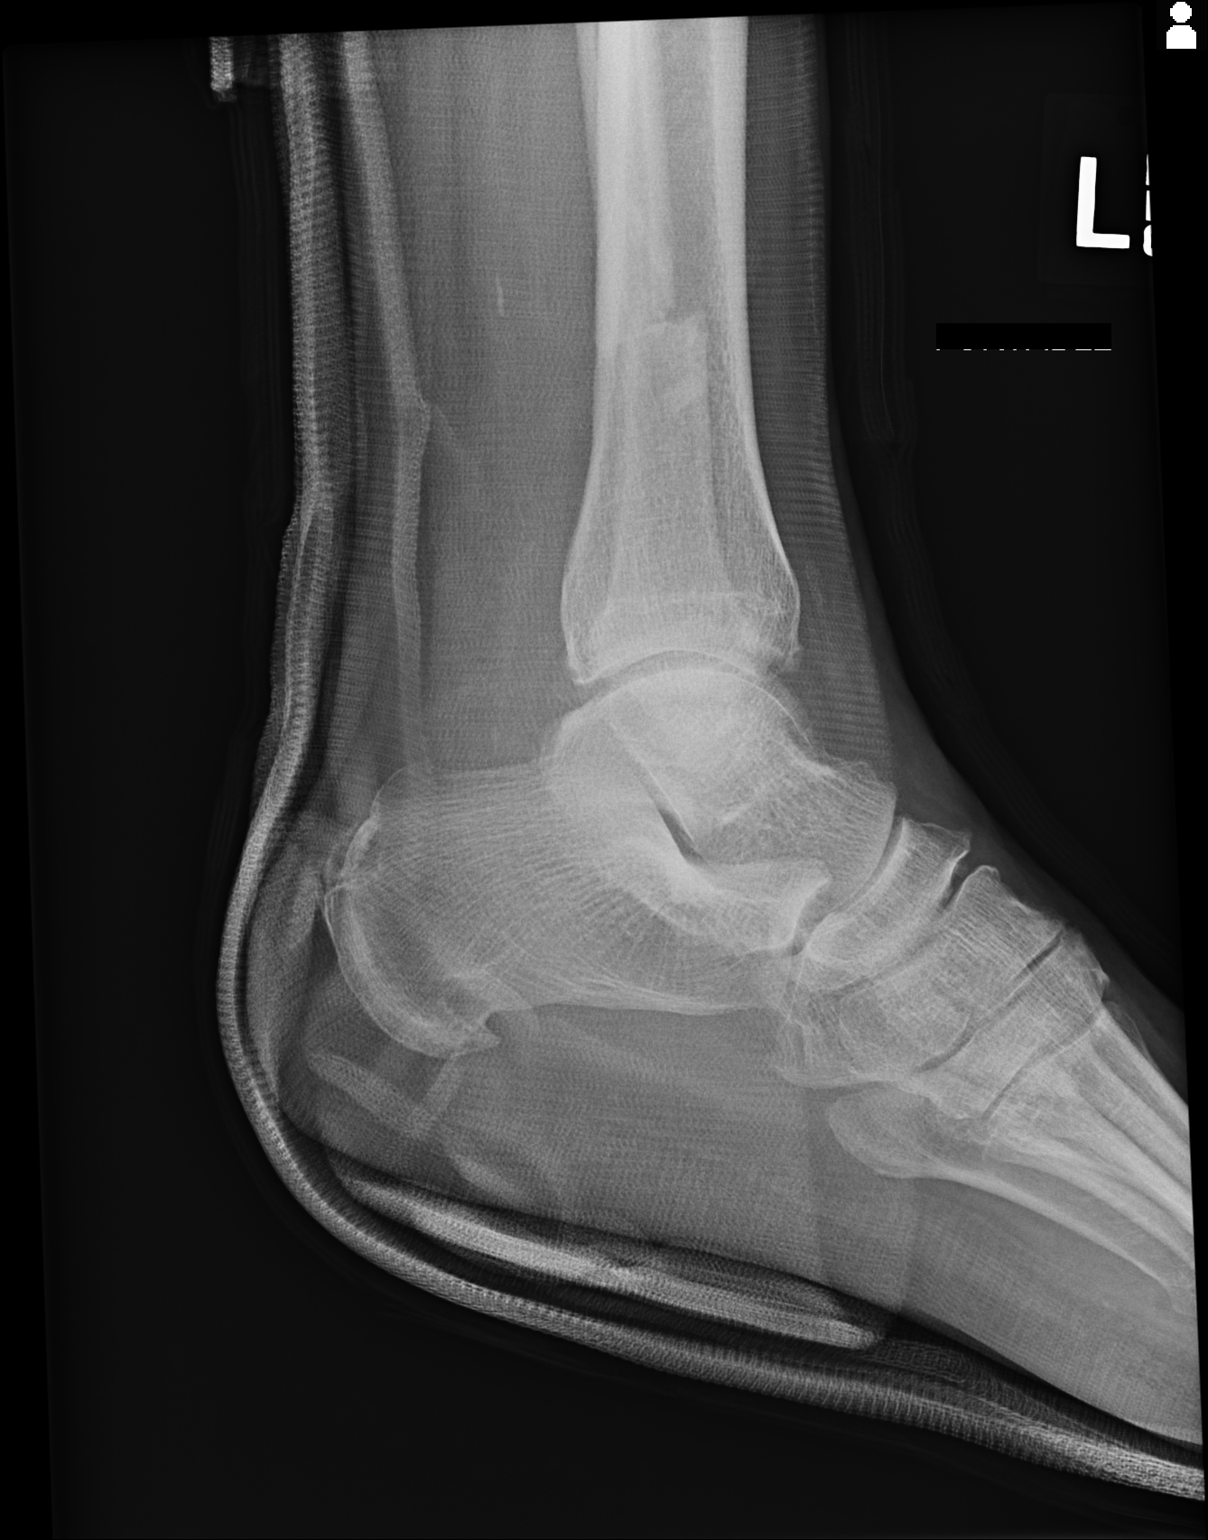

[ap]
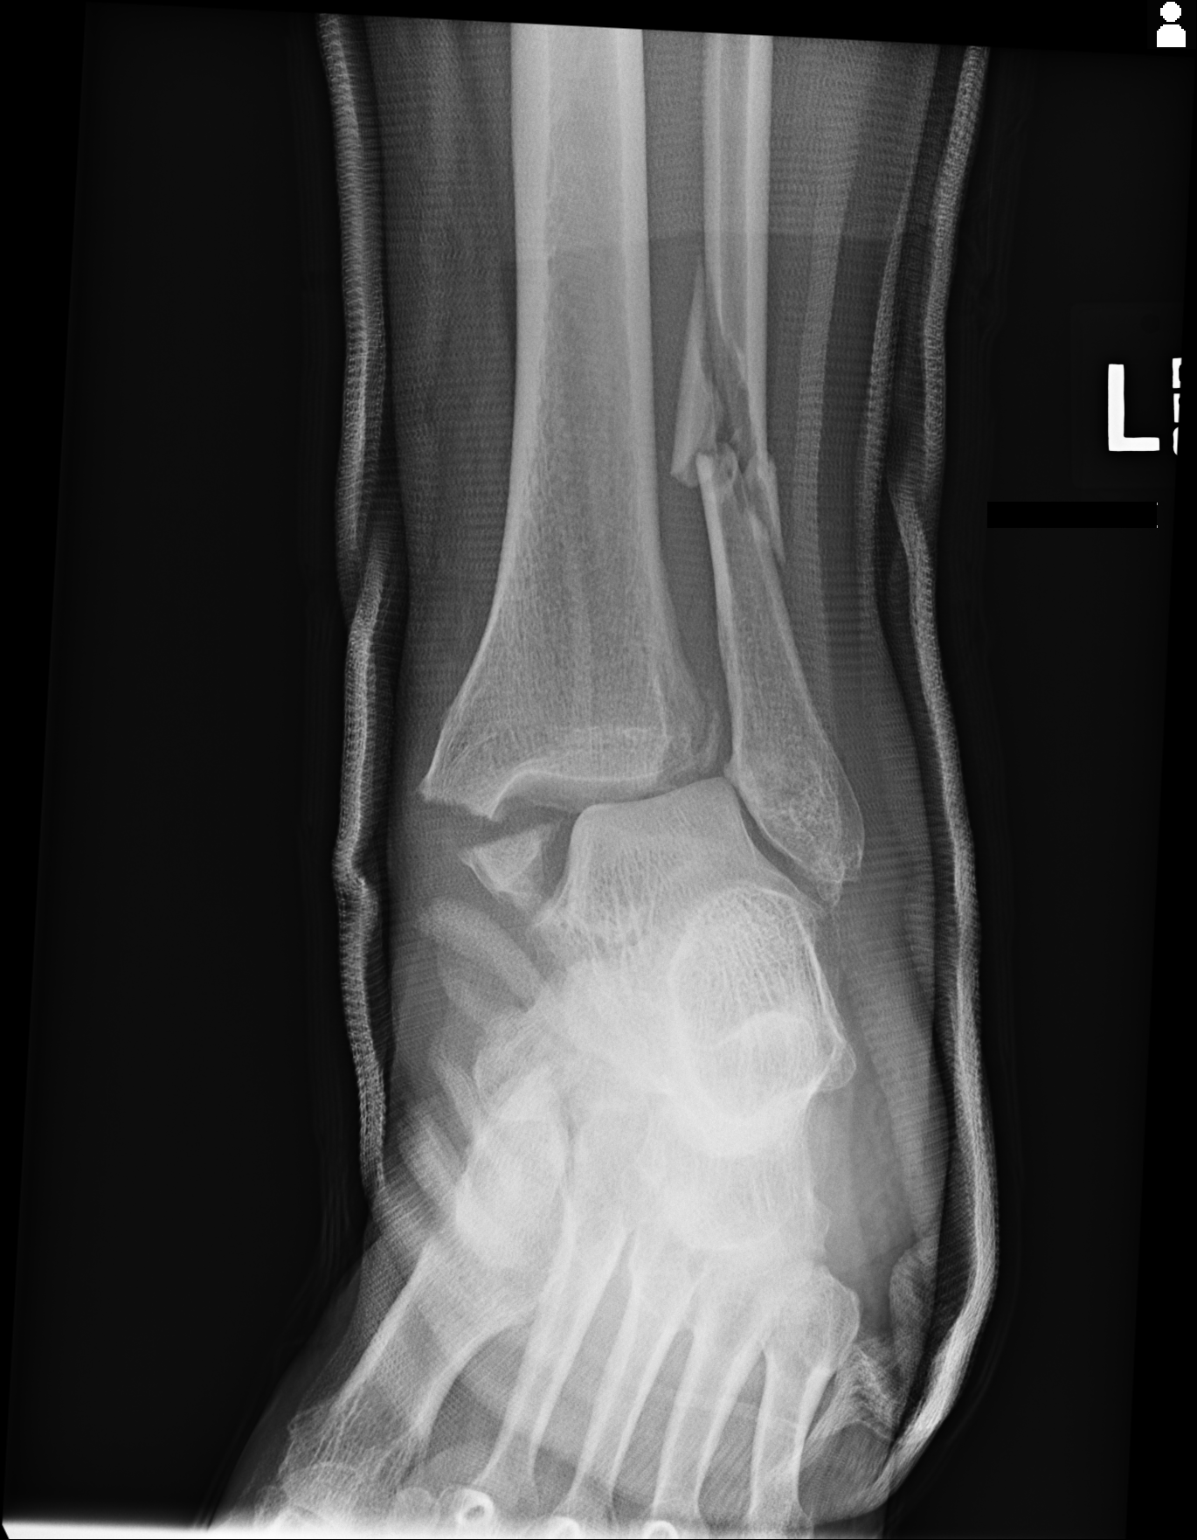

[3 of 3 positions shown; findings below may reference images not displayed]

FINDINGS: New fiberglass cast is seen about a known fracture of the left
ankle. Widening of the medial clear space up to 18 mm is noted with
lateral subluxation of the talar dome relative to the tibial
plafond, minimally decreased from approximately 20 mm. A transverse
fracture of the medial malleolus with lateral displacement,
comminuted laterally angulated fracture of the distal diaphysis of
the fibula as well as a posterolateral corner fracture of the tibial
plafond is seen as before.
IMPRESSION: Acute fracture-subluxation about the ankle joint as seen through
fiberglass.

## 2019-11-04 DIAGNOSIS — H1820 Unspecified corneal edema: Secondary | ICD-10-CM | POA: Diagnosis not present

## 2019-11-04 DIAGNOSIS — H04121 Dry eye syndrome of right lacrimal gland: Secondary | ICD-10-CM | POA: Diagnosis not present

## 2019-11-04 DIAGNOSIS — H1789 Other corneal scars and opacities: Secondary | ICD-10-CM | POA: Diagnosis not present

## 2019-11-04 DIAGNOSIS — H524 Presbyopia: Secondary | ICD-10-CM | POA: Diagnosis not present

## 2019-11-17 ENCOUNTER — Encounter: Payer: Self-pay | Admitting: Urology

## 2019-11-17 ENCOUNTER — Ambulatory Visit: Payer: Medicare HMO | Admitting: Urology

## 2019-11-17 ENCOUNTER — Ambulatory Visit (HOSPITAL_COMMUNITY)
Admission: RE | Admit: 2019-11-17 | Discharge: 2019-11-17 | Disposition: A | Discharge status: Home / Self Care | Payer: Medicare HMO | Source: Ambulatory Visit | Attending: Urology | Admitting: Urology

## 2019-11-17 ENCOUNTER — Ambulatory Visit (INDEPENDENT_AMBULATORY_CARE_PROVIDER_SITE_OTHER): Payer: Medicare HMO | Admitting: Urology

## 2019-11-17 ENCOUNTER — Other Ambulatory Visit: Payer: Self-pay

## 2019-11-17 VITALS — BP 115/70 | HR 76

## 2019-11-17 DIAGNOSIS — N5201 Erectile dysfunction due to arterial insufficiency: Secondary | ICD-10-CM | POA: Diagnosis not present

## 2019-11-17 DIAGNOSIS — I878 Other specified disorders of veins: Secondary | ICD-10-CM | POA: Diagnosis not present

## 2019-11-17 DIAGNOSIS — N200 Calculus of kidney: Secondary | ICD-10-CM

## 2019-11-17 DIAGNOSIS — R310 Gross hematuria: Secondary | ICD-10-CM

## 2019-11-17 LAB — URINALYSIS, ROUTINE W REFLEX MICROSCOPIC
Bilirubin, UA: NEGATIVE
Glucose, UA: NEGATIVE
Ketones, UA: NEGATIVE
Nitrite, UA: NEGATIVE
Specific Gravity, UA: 1.025 (ref 1.005–1.030)
Urobilinogen, Ur: 2 mg/dL — ABNORMAL HIGH (ref 0.2–1.0)
pH, UA: 5 (ref 5.0–7.5)

## 2019-11-17 LAB — MICROSCOPIC EXAMINATION
Bacteria, UA: NONE SEEN
Epithelial Cells (non renal): NONE SEEN /hpf (ref 0–10)
Renal Epithel, UA: NONE SEEN /hpf

## 2019-11-17 NOTE — Progress Notes (Signed)
Urological Symptom Review  Patient is experiencing the following symptoms: none   Review of Systems  Gastrointestinal (upper)  : Negative for upper GI symptoms  Gastrointestinal (lower) : Negative for lower GI symptoms  Constitutional : Negative for symptoms  Skin: Negative for skin symptoms  Eyes: Negative for eye symptoms  Ear/Nose/Throat : Negative for Ear/Nose/Throat symptoms  Hematologic/Lymphatic: Negative for Hematologic/Lymphatic symptoms  Cardiovascular : Leg swelling  Respiratory : Negative for respiratory symptoms  Endocrine: Negative for endocrine symptoms  Musculoskeletal: Negative for musculoskeletal symptoms  Neurological: Negative for neurological symptoms  Psychologic: Negative for psychiatric symptoms  

## 2019-11-17 NOTE — Progress Notes (Signed)
H&P  Chief Complaint: Flank Pain (R)  History of Present Illness:  Pt reports that approximately three weeks ago he started feeling an intermittent, nagging, dull flank pain (R) and less pronounced pain in his left side. Pt denies associated nausea and notes that his pain will respond to movement. His pain has recently diminished but he was concerned and wanted to f/u with a doctor before dismissing the episode as insignificant.  IPSS Questionnaire (AUA-7): Over the past month.   1)  How often have you had a sensation of not emptying your bladder completely after you finish urinating?  0 - Not at all  2)  How often have you had to urinate again less than two hours after you finished urinating? 0 - Not at all  3)  How often have you found you stopped and started again several times when you urinated?  0 - Not at all  4) How difficult have you found it to postpone urination?  2 - Less than half the time  5) How often have you had a weak urinary stream?  2 - Less than half the time  6) How often have you had to push or strain to begin urination?  0 - Not at all  7) How many times did you most typically get up to urinate from the time you went to bed until the time you got up in the morning?  2 - 2 times  Total score:  0-7 mildly symptomatic   8-19 moderately symptomatic   20-35 severely symptomatic  QOL score: 4   (below copied from [facility] records):  11.10.2020: CC: I have kidney stones.  HPI: Kao Conry is a 69 year-old male established patient who is here for renal calculi.  The problem is on the left side.   He has had eswl and ureteral stent for treatment of his stones in the past.   4.3.2018: UA today has 3+ blood. His last CT scan was done by Dr. Janice Norrie. He has a hx of lithotripsy with ureteral stent placement over 5 years ago.   12/26/2016: No flank pain. KUB shows stone has increased in size from 43mm to 54mm.   9.24.2019: No stone sx's since his last visit here. He has had no  blood in his urine.   11.10.2020: He returns today for follow-up. He denies having passed any stones since last visit nor having any sx's of one. He states he has not been practicing much in the way of stone prevention but has been drinking a large amount of sugar-free lemonade.     CC/HPI: I have blood in my urine.     11.10.2020: No blood per urine since last visit.       Past Medical History:  Diagnosis Date  . Arthritis   . Asthma   . Chronic kidney disease   . Diabetes mellitus   . GERD (gastroesophageal reflux disease)   . History of kidney stones   . Hyperlipidemia   . Hypertension   . Palpitations     Past Surgical History:  Procedure Laterality Date  . COLONOSCOPY  02/15/2011   Procedure: COLONOSCOPY;  Surgeon: Rogene Houston, MD;  [facility];  Service: Endoscopy;  Laterality: N/A;  . COLONOSCOPY N/A 09/23/2019   Procedure: COLONOSCOPY;  Surgeon: Rogene Houston, MD;  [facility];  Service: Endoscopy;  Laterality: N/A;  730  . CYSTOSCOPY  2011  . ESOPHAGEAL DILATION N/A 07/08/2014   Procedure: ESOPHAGEAL DILATION;  Surgeon: Rogene Houston, MD;  [facility];  Service: Endoscopy;  Laterality: N/A;  . ESOPHAGOGASTRODUODENOSCOPY N/A 07/08/2014   Procedure: ESOPHAGOGASTRODUODENOSCOPY (EGD);  Surgeon: Rogene Houston, MD;  [facility];  Service: Endoscopy;  Laterality: N/A;  245  . ORIF ANKLE FRACTURE Left 03/19/2017   Procedure: OPEN REDUCTION INTERNAL FIXATION (ORIF) LEFT ANKLE FRACTURE;  Surgeon: Carole Civil, MD;  Location: [facility];  Service: Orthopedics;  Laterality: Left;  . TONSILLECTOMY     age 69  . TONSILLECTOMY      Home Medications:  Allergies as of 11/17/2019      Reactions   Shellfish Allergy Nausea And Vomiting      Medication List       Accurate as of November 17, 2019  1:01 PM. If you have any questions, ask your nurse or doctor.        aspirin EC 81 MG tablet Take 81 mg by mouth daily.    atorvastatin 20 MG tablet Commonly known as: LIPITOR Take 20 mg by mouth daily.   Cartia XT 300 MG 24 hr capsule Generic drug: diltiazem Take 300 mg by mouth daily.   ferrous sulfate 325 (65 FE) MG tablet Take 325 mg by mouth daily with breakfast.   fluticasone 50 MCG/ACT nasal spray Commonly known as: FLONASE Place 1 spray into both nostrils daily.   lisinopril-hydrochlorothiazide 20-25 MG tablet Commonly known as: ZESTORETIC Take 1 tablet by mouth daily.   metFORMIN 1000 MG tablet Commonly known as: GLUCOPHAGE Take 1,000 mg by mouth 2 (two) times daily with a meal.   pantoprazole 40 MG tablet Commonly known as: Protonix Take 1 tablet (40 mg total) by mouth daily. Take 30 min before breakfast   Vitamin D3 1.25 MG (50000 UT) Caps Take 50,000 Units by mouth once a week. Friday       Allergies:  Allergies  Allergen Reactions  . Shellfish Allergy Nausea And Vomiting    Family History  Problem Relation Age of Onset  . Heart attack Maternal Grandmother   . Stroke Maternal Grandfather     Social History:  reports that he quit smoking about 2 years ago. His smoking use included cigarettes. He has a 25.00 pack-year smoking history. He has never used smokeless tobacco. He reports current alcohol use of about 12.0 standard drinks of alcohol per week. He reports that he does not use drugs.  ROS: A complete review of systems was performed.  All systems are negative except for pertinent findings as noted.  Physical Exam:  Vital signs in last 24 hours: There were no vitals taken for this visit. Constitutional:  Alert and oriented, No acute distress Cardiovascular: Regular rate  Respiratory: Normal respiratory effort GI: Abdomen is soft, nontender, nondistended, no abdominal masses. No CVAT.  Lymphatic: No lymphadenopathy Neurologic: Grossly intact, no focal deficits Psychiatric: Normal mood and affect NO CVA tenderness  I have reviewed prior pt notes  I have  reviewed notes from referring/previous physicians  I have reviewed urinalysis results  I have independently reviewed prior imaging he has had a fairly stable lower pole stone burden on the left.  Impression/Assessment:  1. Right flank pain: probable non-urologic 2. H/o renal calculi: chronic and stable  Plan:  1. Pt to f/u with radiology for KUB at next available appointment.  2. F/U in one year for OV and symptom recheck  CC: Dr. Collene Mares

## 2019-11-18 ENCOUNTER — Encounter: Payer: Self-pay | Admitting: Urology

## 2019-11-19 NOTE — Progress Notes (Signed)
Results sent via my chart 

## 2019-12-08 ENCOUNTER — Ambulatory Visit: Payer: Medicare HMO | Admitting: Urology

## 2020-02-09 DIAGNOSIS — R69 Illness, unspecified: Secondary | ICD-10-CM | POA: Diagnosis not present

## 2020-02-25 DIAGNOSIS — R69 Illness, unspecified: Secondary | ICD-10-CM | POA: Diagnosis not present

## 2020-03-14 DIAGNOSIS — H1820 Unspecified corneal edema: Secondary | ICD-10-CM | POA: Diagnosis not present

## 2020-03-14 DIAGNOSIS — H25813 Combined forms of age-related cataract, bilateral: Secondary | ICD-10-CM | POA: Diagnosis not present

## 2020-03-14 DIAGNOSIS — B0059 Other herpesviral disease of eye: Secondary | ICD-10-CM | POA: Diagnosis not present

## 2020-03-16 ENCOUNTER — Telehealth (INDEPENDENT_AMBULATORY_CARE_PROVIDER_SITE_OTHER): Payer: Medicare HMO | Admitting: Gastroenterology

## 2020-03-16 ENCOUNTER — Ambulatory Visit (INDEPENDENT_AMBULATORY_CARE_PROVIDER_SITE_OTHER): Payer: Medicare HMO | Admitting: Gastroenterology

## 2020-03-16 ENCOUNTER — Encounter (INDEPENDENT_AMBULATORY_CARE_PROVIDER_SITE_OTHER): Payer: Self-pay | Admitting: Gastroenterology

## 2020-03-16 ENCOUNTER — Other Ambulatory Visit: Payer: Self-pay

## 2020-03-16 DIAGNOSIS — K219 Gastro-esophageal reflux disease without esophagitis: Secondary | ICD-10-CM | POA: Insufficient documentation

## 2020-03-16 MED ORDER — PANTOPRAZOLE SODIUM 40 MG PO TBEC: 40.0000 mg | DELAYED_RELEASE_TABLET | Freq: Every day | ORAL | 3 refills | Status: DC

## 2020-03-16 NOTE — Patient Instructions (Signed)
Continue pantoprazole 40 mg qday Explained presumed etiology of reflux symptoms. Instruction provided in the use of antireflux medication - patient should take medication in the morning 30-45 minutes before eating breakfast. Discussed avoidance of eating within 2 hours of lying down to sleep and benefit of blocks to elevate head of bed.

## 2020-03-16 NOTE — Progress Notes (Signed)
Reginald Stephens, M.D. Gastroenterology & Hepatology [facility] [address] [place],  [address] Primary Care Physician: Cory Munch, PA-C [place] [id number]  This is a telephone virtual visit.  It required patient-provider interaction for the medical decision making as documented below. The patient has consented and agreed to proceed with a Telehealth encounter given the current Coronavirus pandemic.  VIRTUAL VISIT NOTE Patient location: Home Provider location: Office  I will communicate my assessment and recommendations to the referring MD via EMR.  Problems: 1. GERD  History of Present Illness: Reginald Stephens is a 70 y.o. male with PMH GERD, CKD, asthma, DM, HTN, HLD, who presents for f/u of GERD.  Patient was last seen in clinic on 03/16/2019.  Given adequate response to PPI, the patient was continued on Protonix 40 mg daily.  Patient reports that he is feeling well at the moment.  He has tried eating small portions at night and with head of bed elevated, which controls his symptoms as long as he is also taking his pantoprazole. Takes pantoprazole 40 mg every day, takes it first in the AM and has breakfast. This has controlled his heartburn adequately.  Denies having any odynophagia, dysphagia, nausea, vomiting, fever, chills, hematochezia, melena, hematemesis, abdominal distention, abdominal pain, diarrhea, jaundice, pruritus or weight loss.  Last Colonoscopy: 09/23/2019 - diverticulosis Last EGD: 2016-Non Critical schatzki's ring, Hiatal Hernia, Hiatus at 43cm, GE Junction at 40cm  FHx: neg for any gastrointestinal/liver disease, no malignancies Social: quit smoking 3 years ago, alcohol or illicit drug use  Past Medical History: Past Medical History:  Diagnosis Date  . Arthritis   . Asthma   . Chronic kidney disease   . Diabetes mellitus   . GERD (gastroesophageal reflux disease)    . History of kidney stones   . Hyperlipidemia   . Hypertension   . Palpitations     Past Surgical History: Past Surgical History:  Procedure Laterality Date  . COLONOSCOPY  02/15/2011   Procedure: COLONOSCOPY;  Surgeon: Rogene Houston, MD;  [facility];  Service: Endoscopy;  Laterality: N/A;  . COLONOSCOPY N/A 09/23/2019   Procedure: COLONOSCOPY;  Surgeon: Rogene Houston, MD;  [facility];  Service: Endoscopy;  Laterality: N/A;  730  . CYSTOSCOPY  2011  . ESOPHAGEAL DILATION N/A 07/08/2014   Procedure: ESOPHAGEAL DILATION;  Surgeon: Rogene Houston, MD;  [facility];  Service: Endoscopy;  Laterality: N/A;  . ESOPHAGOGASTRODUODENOSCOPY N/A 07/08/2014   Procedure: ESOPHAGOGASTRODUODENOSCOPY (EGD);  Surgeon: Rogene Houston, MD;  [facility];  Service: Endoscopy;  Laterality: N/A;  245  . ORIF ANKLE FRACTURE Left 03/19/2017   Procedure: OPEN REDUCTION INTERNAL FIXATION (ORIF) LEFT ANKLE FRACTURE;  Surgeon: Carole Civil, MD;  Location: [facility];  Service: Orthopedics;  Laterality: Left;  . TONSILLECTOMY     age 65  . TONSILLECTOMY      Family History: Family History  Problem Relation Age of Onset  . Heart attack Maternal Grandmother   . Stroke Maternal Grandfather     Social History: Social History   Tobacco Use  Smoking Status Former Smoker  . Packs/day: 1.00  . Years: 25.00  . Pack years: 25.00  . Types: Cigarettes  . Quit date: 03/19/2017  . Years since quitting: 2.9  Smokeless Tobacco Never Used   Social History   Substance and Sexual Activity  Alcohol Use Yes  . Alcohol/week:  12.0 standard drinks  . Types: 12 Cans of beer per week   Social History   Substance and Sexual Activity  Drug Use No    Allergies: Allergies  Allergen Reactions  . Shellfish Allergy Nausea And Vomiting    Medications: Current Outpatient Medications  Medication Sig Dispense Refill  . aspirin EC 81 MG tablet Take 81 mg by mouth  daily.    Marland Kitchen atorvastatin (LIPITOR) 20 MG tablet Take 20 mg by mouth daily.    . Cholecalciferol (VITAMIN D3) 50000 units CAPS Take 50,000 Units by mouth once a week. Friday  11  . ferrous sulfate 325 (65 FE) MG tablet Take 325 mg by mouth daily with breakfast.    . fluticasone (FLONASE) 50 MCG/ACT nasal spray Place 1 spray into both nostrils daily.   1  . lisinopril-hydrochlorothiazide (PRINZIDE,ZESTORETIC) 20-25 MG per tablet Take 1 tablet by mouth daily.     . metFORMIN (GLUCOPHAGE) 1000 MG tablet Take 1,000 mg by mouth 2 (two) times daily with a meal.    . pantoprazole (PROTONIX) 40 MG tablet Take 1 tablet (40 mg total) by mouth daily. Take 30 min before breakfast 90 tablet 3  . TIADYLT ER 300 MG 24 hr capsule Take by mouth.     No current facility-administered medications for this visit.    Review of Systems: GENERAL: negative for malaise, night sweats HEENT: No changes in hearing or vision, no nose bleeds or other nasal problems. NECK: Negative for lumps, goiter, pain and significant neck swelling RESPIRATORY: Negative for cough, wheezing CARDIOVASCULAR: Negative for chest pain, leg swelling, palpitations, orthopnea GI: SEE HPI MUSCULOSKELETAL: Negative for joint pain or swelling, back pain, and muscle pain. SKIN: Negative for lesions, rash PSYCH: Negative for sleep disturbance, mood disorder and recent psychosocial stressors. HEMATOLOGY Negative for prolonged bleeding, bruising easily, and swollen nodes. ENDOCRINE: Negative for cold or heat intolerance, polyuria, polydipsia and goiter. NEURO: negative for tremor, gait imbalance, syncope and seizures. The remainder of the review of systems is noncontributory.   Physical Exam: No exam was performed as this was a telephone encounter  Imaging/Labs: as above  I personally reviewed and interpreted the available labs, imaging and endoscopic files.  Impression and Plan: Reginald Stephens is a 70 y.o. male with PMH GERD, CKD, asthma,  DM, HTN, HLD, who presents for f/u of GERD.  Patient has presented adequate control of his acid reflux while taking PPI once a day.  He used to take it twice a day but with daily dosing he has had remission of symptoms.  He has not presented any red flag signs.  We will continue with the current dose of pantoprazole.  I reinforced the importance of following lifestyle changes which he understood.  - Continue pantoprazole 40 mg qday - Explained presumed etiology of reflux symptoms. Instruction provided in the use of antireflux medication - patient should take medication in the morning 30-45 minutes before eating breakfast. Discussed avoidance of eating within 2 hours of lying down to sleep and benefit of blocks to elevate head of bed. - RTC 1 year  All questions were answered.      Total visit time: I spent a total of  20 minutes  Reginald Peppers, MD Gastroenterology and Hepatology [facility]

## 2020-04-14 DIAGNOSIS — H1820 Unspecified corneal edema: Secondary | ICD-10-CM | POA: Diagnosis not present

## 2020-04-14 DIAGNOSIS — H40042 Steroid responder, left eye: Secondary | ICD-10-CM | POA: Diagnosis not present

## 2020-04-14 DIAGNOSIS — H40052 Ocular hypertension, left eye: Secondary | ICD-10-CM | POA: Diagnosis not present

## 2020-05-02 DIAGNOSIS — E119 Type 2 diabetes mellitus without complications: Secondary | ICD-10-CM | POA: Diagnosis not present

## 2020-05-02 DIAGNOSIS — Z1331 Encounter for screening for depression: Secondary | ICD-10-CM | POA: Diagnosis not present

## 2020-05-02 DIAGNOSIS — I10 Essential (primary) hypertension: Secondary | ICD-10-CM | POA: Diagnosis not present

## 2020-05-02 DIAGNOSIS — Z1389 Encounter for screening for other disorder: Secondary | ICD-10-CM | POA: Diagnosis not present

## 2020-05-02 DIAGNOSIS — E6609 Other obesity due to excess calories: Secondary | ICD-10-CM | POA: Diagnosis not present

## 2020-05-02 DIAGNOSIS — Z6833 Body mass index (BMI) 33.0-33.9, adult: Secondary | ICD-10-CM | POA: Diagnosis not present

## 2020-05-09 DIAGNOSIS — H1789 Other corneal scars and opacities: Secondary | ICD-10-CM | POA: Diagnosis not present

## 2020-05-09 DIAGNOSIS — H1812 Bullous keratopathy, left eye: Secondary | ICD-10-CM | POA: Diagnosis not present

## 2020-05-09 DIAGNOSIS — H25813 Combined forms of age-related cataract, bilateral: Secondary | ICD-10-CM | POA: Diagnosis not present

## 2020-05-09 DIAGNOSIS — H40052 Ocular hypertension, left eye: Secondary | ICD-10-CM | POA: Diagnosis not present

## 2020-06-08 DIAGNOSIS — H2513 Age-related nuclear cataract, bilateral: Secondary | ICD-10-CM | POA: Diagnosis not present

## 2020-06-08 DIAGNOSIS — H1820 Unspecified corneal edema: Secondary | ICD-10-CM | POA: Diagnosis not present

## 2020-06-08 DIAGNOSIS — H40033 Anatomical narrow angle, bilateral: Secondary | ICD-10-CM | POA: Diagnosis not present

## 2020-06-08 DIAGNOSIS — H18451 Nodular corneal degeneration, right eye: Secondary | ICD-10-CM | POA: Diagnosis not present

## 2020-06-20 DIAGNOSIS — H2513 Age-related nuclear cataract, bilateral: Secondary | ICD-10-CM | POA: Diagnosis not present

## 2020-06-20 DIAGNOSIS — H4020X0 Unspecified primary angle-closure glaucoma, stage unspecified: Secondary | ICD-10-CM | POA: Diagnosis not present

## 2020-07-06 DIAGNOSIS — H1820 Unspecified corneal edema: Secondary | ICD-10-CM | POA: Diagnosis not present

## 2020-07-06 DIAGNOSIS — H18451 Nodular corneal degeneration, right eye: Secondary | ICD-10-CM | POA: Diagnosis not present

## 2020-07-06 DIAGNOSIS — H40033 Anatomical narrow angle, bilateral: Secondary | ICD-10-CM | POA: Diagnosis not present

## 2020-07-06 DIAGNOSIS — H2513 Age-related nuclear cataract, bilateral: Secondary | ICD-10-CM | POA: Diagnosis not present

## 2020-07-13 DIAGNOSIS — M546 Pain in thoracic spine: Secondary | ICD-10-CM | POA: Diagnosis not present

## 2020-07-13 DIAGNOSIS — E6609 Other obesity due to excess calories: Secondary | ICD-10-CM | POA: Diagnosis not present

## 2020-07-13 DIAGNOSIS — Z6832 Body mass index (BMI) 32.0-32.9, adult: Secondary | ICD-10-CM | POA: Diagnosis not present

## 2020-08-02 ENCOUNTER — Ambulatory Visit: Payer: Medicare HMO | Admitting: Urology

## 2020-08-02 ENCOUNTER — Other Ambulatory Visit: Payer: Self-pay

## 2020-08-02 ENCOUNTER — Encounter: Payer: Self-pay | Admitting: Urology

## 2020-08-02 VITALS — BP 123/75 | HR 69

## 2020-08-02 DIAGNOSIS — R310 Gross hematuria: Secondary | ICD-10-CM | POA: Diagnosis not present

## 2020-08-02 DIAGNOSIS — N200 Calculus of kidney: Secondary | ICD-10-CM

## 2020-08-02 DIAGNOSIS — N5201 Erectile dysfunction due to arterial insufficiency: Secondary | ICD-10-CM | POA: Diagnosis not present

## 2020-08-02 LAB — URINALYSIS, ROUTINE W REFLEX MICROSCOPIC
Bilirubin, UA: NEGATIVE
Glucose, UA: NEGATIVE
Ketones, UA: NEGATIVE
Leukocytes,UA: NEGATIVE
Nitrite, UA: NEGATIVE
Protein,UA: NEGATIVE
Specific Gravity, UA: 1.025 (ref 1.005–1.030)
Urobilinogen, Ur: 1 mg/dL (ref 0.2–1.0)
pH, UA: 5.5 (ref 5.0–7.5)

## 2020-08-02 LAB — MICROSCOPIC EXAMINATION
Bacteria, UA: NONE SEEN
Renal Epithel, UA: NONE SEEN /hpf
WBC, UA: NONE SEEN /hpf (ref 0–5)

## 2020-08-02 NOTE — Progress Notes (Signed)
History of Present Illness: Here for follow-up of nephrolithiasis.  Since his last visit, he denies any left-sided pain.  He did have a KUB last year which revealed a stable left lower pole stone.  He has recent right-sided back/flank pain, mainly with moving around, relieved by sitting still.  No gross hematuria, no dysuria, no left-sided pain.  He has not had a recent KUB.  The patient said that his PSAs have been checked by [facility] and have been normal.  Past Medical History:  Diagnosis Date  . Arthritis   . Asthma   . Chronic kidney disease   . Diabetes mellitus   . GERD (gastroesophageal reflux disease)   . History of kidney stones   . Hyperlipidemia   . Hypertension   . Palpitations     Past Surgical History:  Procedure Laterality Date  . COLONOSCOPY  02/15/2011   Procedure: COLONOSCOPY;  Surgeon: Rogene Houston, MD;  [facility];  Service: Endoscopy;  Laterality: N/A;  . COLONOSCOPY N/A 09/23/2019   Procedure: COLONOSCOPY;  Surgeon: Rogene Houston, MD;  [facility];  Service: Endoscopy;  Laterality: N/A;  730  . CYSTOSCOPY  2011  . ESOPHAGEAL DILATION N/A 07/08/2014   Procedure: ESOPHAGEAL DILATION;  Surgeon: Rogene Houston, MD;  [facility];  Service: Endoscopy;  Laterality: N/A;  . ESOPHAGOGASTRODUODENOSCOPY N/A 07/08/2014   Procedure: ESOPHAGOGASTRODUODENOSCOPY (EGD);  Surgeon: Rogene Houston, MD;  [facility];  Service: Endoscopy;  Laterality: N/A;  245  . ORIF ANKLE FRACTURE Left 03/19/2017   Procedure: OPEN REDUCTION INTERNAL FIXATION (ORIF) LEFT ANKLE FRACTURE;  Surgeon: Carole Civil, MD;  [facility]: [facility];  Service: Orthopedics;  Laterality: Left;  . TONSILLECTOMY     age 70  . TONSILLECTOMY      Home Medications:  Allergies as of 08/02/2020      Reactions   Shellfish Allergy Nausea And Vomiting      Medication List       Accurate as of Aug 02, 2020  8:48 AM. If you have any questions,  ask your nurse or doctor.        aspirin EC 81 MG tablet Take 81 mg by mouth daily.   atorvastatin 20 MG tablet Commonly known as: LIPITOR Take 20 mg by mouth daily.   ferrous sulfate 325 (65 FE) MG tablet Take 325 mg by mouth daily with breakfast.   fluticasone 50 MCG/ACT nasal spray Commonly known as: FLONASE Place 1 spray into both nostrils daily.   lisinopril-hydrochlorothiazide 20-25 MG tablet Commonly known as: ZESTORETIC Take 1 tablet by mouth daily.   metFORMIN 1000 MG tablet Commonly known as: GLUCOPHAGE Take 1,000 mg by mouth 2 (two) times daily with a meal.   pantoprazole 40 MG tablet Commonly known as: Protonix Take 1 tablet (40 mg total) by mouth daily. Take 30 min before breakfast   Tiadylt ER 300 MG 24 hr capsule Generic drug: diltiazem Take by mouth.   Vitamin D3 1.25 MG (50000 UT) Caps Take 50,000 Units by mouth once a week. Friday       Allergies:  Allergies  Allergen Reactions  . Shellfish Allergy Nausea And Vomiting    Family History  Problem Relation Age of Onset  . Heart attack Maternal Grandmother   . Stroke Maternal Grandfather     Social History:  reports that he quit smoking about 3 years ago. His smoking use included cigarettes. He has a 25.00 pack-year smoking  history. He has never used smokeless tobacco. He reports current alcohol use of about 12.0 standard drinks of alcohol per week. He reports that he does not use drugs.  ROS: A complete review of systems was performed.  All systems are negative except for pertinent findings as noted.  Physical Exam:  Vital signs in last 24 hours: There were no vitals taken for this visit. Constitutional:  Alert and oriented, No acute distress Cardiovascular: Regular rate  Respiratory: Normal respiratory effort GI: Abdomen is soft, nontender, nondistended, no abdominal masses. No CVAT.  Genitourinary: Normal male phallus, testes are descended bilaterally and non-tender and without masses,  scrotum is normal in appearance without lesions or masses, perineum is normal on inspection.  Prostate 30 mL in size, symmetrical, nonnodular, nontender. Lymphatic: No lymphadenopathy Neurologic: Grossly intact, no focal deficits Psychiatric: Normal mood and affect  I have reviewed prior pt notes  I have reviewed notes from referring/previous physicians  I have reviewed urinalysis results  I have independently reviewed prior imaging--most recent KUB from 2021 revealed a 17 mm group of calculi in the left lower pole.  No other calculi noted.    Impression/Assessment:  Left lower pole stone(s).  Growth between 2018 and 2021 of 2 mm.  Currently asymptomatic  Plan:  1.  I do recommend that he continue to have his PSA followed at [place]  2.  I will have a KUB performed in the near future.  If stone is stable, no need for treatment  3.  I will have him come back in a year for recheck

## 2020-08-02 NOTE — Progress Notes (Signed)
Urological Symptom Review  Patient is experiencing the following symptoms: none    Review of Systems  Gastrointestinal (upper)  : Negative for upper GI symptoms  Gastrointestinal (lower) : Diarrhea  Constitutional : Negative for symptoms  Skin: Negative for skin symptoms  Eyes: Blurred vision  Ear/Nose/Throat : Sinus problems  Hematologic/Lymphatic: Negative for Hematologic/Lymphatic symptoms  Cardiovascular : Leg swelling  Respiratory : Negative for respiratory symptoms  Endocrine: Negative for endocrine symptoms  Musculoskeletal: Back pain  Neurological: Negative for neurological symptoms  Psychologic: Negative for psychiatric symptoms

## 2020-08-15 ENCOUNTER — Ambulatory Visit (HOSPITAL_COMMUNITY)
Admission: RE | Admit: 2020-08-15 | Discharge: 2020-08-15 | Disposition: A | Discharge status: Home / Self Care | Payer: Medicare HMO | Source: Ambulatory Visit | Attending: Urology | Admitting: Urology

## 2020-08-15 ENCOUNTER — Other Ambulatory Visit: Payer: Self-pay

## 2020-08-15 DIAGNOSIS — N200 Calculus of kidney: Secondary | ICD-10-CM | POA: Diagnosis not present

## 2020-08-15 DIAGNOSIS — R109 Unspecified abdominal pain: Secondary | ICD-10-CM | POA: Diagnosis not present

## 2020-08-22 DIAGNOSIS — H2513 Age-related nuclear cataract, bilateral: Secondary | ICD-10-CM | POA: Diagnosis not present

## 2020-08-22 DIAGNOSIS — H40043 Steroid responder, bilateral: Secondary | ICD-10-CM | POA: Diagnosis not present

## 2020-08-22 DIAGNOSIS — H1820 Unspecified corneal edema: Secondary | ICD-10-CM | POA: Diagnosis not present

## 2020-08-22 DIAGNOSIS — H18451 Nodular corneal degeneration, right eye: Secondary | ICD-10-CM | POA: Diagnosis not present

## 2020-08-22 DIAGNOSIS — H40033 Anatomical narrow angle, bilateral: Secondary | ICD-10-CM | POA: Diagnosis not present

## 2020-08-26 ENCOUNTER — Encounter: Payer: Self-pay | Admitting: Urology

## 2020-08-30 NOTE — Progress Notes (Signed)
Sent via mychart

## 2020-10-14 DIAGNOSIS — E1129 Type 2 diabetes mellitus with other diabetic kidney complication: Secondary | ICD-10-CM | POA: Diagnosis not present

## 2020-10-14 DIAGNOSIS — Z1389 Encounter for screening for other disorder: Secondary | ICD-10-CM | POA: Diagnosis not present

## 2020-10-14 DIAGNOSIS — Z1331 Encounter for screening for depression: Secondary | ICD-10-CM | POA: Diagnosis not present

## 2020-10-14 DIAGNOSIS — E782 Mixed hyperlipidemia: Secondary | ICD-10-CM | POA: Diagnosis not present

## 2020-10-14 DIAGNOSIS — I10 Essential (primary) hypertension: Secondary | ICD-10-CM | POA: Diagnosis not present

## 2020-10-14 DIAGNOSIS — E6609 Other obesity due to excess calories: Secondary | ICD-10-CM | POA: Diagnosis not present

## 2020-10-14 DIAGNOSIS — Z6832 Body mass index (BMI) 32.0-32.9, adult: Secondary | ICD-10-CM | POA: Diagnosis not present

## 2020-10-14 DIAGNOSIS — Z0001 Encounter for general adult medical examination with abnormal findings: Secondary | ICD-10-CM | POA: Diagnosis not present

## 2020-10-14 DIAGNOSIS — Z125 Encounter for screening for malignant neoplasm of prostate: Secondary | ICD-10-CM | POA: Diagnosis not present

## 2020-11-17 DIAGNOSIS — H2512 Age-related nuclear cataract, left eye: Secondary | ICD-10-CM | POA: Diagnosis not present

## 2020-11-17 DIAGNOSIS — Z01818 Encounter for other preprocedural examination: Secondary | ICD-10-CM | POA: Diagnosis not present

## 2020-11-17 DIAGNOSIS — H1820 Unspecified corneal edema: Secondary | ICD-10-CM | POA: Diagnosis not present

## 2020-11-17 DIAGNOSIS — H2513 Age-related nuclear cataract, bilateral: Secondary | ICD-10-CM | POA: Diagnosis not present

## 2020-11-22 ENCOUNTER — Ambulatory Visit: Payer: Medicare HMO | Admitting: Urology

## 2020-11-24 DIAGNOSIS — H18512 Endothelial corneal dystrophy, left eye: Secondary | ICD-10-CM | POA: Diagnosis not present

## 2020-11-24 DIAGNOSIS — H1820 Unspecified corneal edema: Secondary | ICD-10-CM | POA: Diagnosis not present

## 2020-11-24 DIAGNOSIS — H18511 Endothelial corneal dystrophy, right eye: Secondary | ICD-10-CM | POA: Diagnosis not present

## 2020-11-24 DIAGNOSIS — H2512 Age-related nuclear cataract, left eye: Secondary | ICD-10-CM | POA: Diagnosis not present

## 2020-11-24 DIAGNOSIS — H2511 Age-related nuclear cataract, right eye: Secondary | ICD-10-CM | POA: Diagnosis not present

## 2020-12-02 DIAGNOSIS — H40043 Steroid responder, bilateral: Secondary | ICD-10-CM | POA: Diagnosis not present

## 2020-12-02 DIAGNOSIS — H269 Unspecified cataract: Secondary | ICD-10-CM | POA: Diagnosis not present

## 2020-12-02 DIAGNOSIS — H18451 Nodular corneal degeneration, right eye: Secondary | ICD-10-CM | POA: Diagnosis not present

## 2020-12-02 DIAGNOSIS — H40033 Anatomical narrow angle, bilateral: Secondary | ICD-10-CM | POA: Diagnosis not present

## 2020-12-02 DIAGNOSIS — T868419 Corneal transplant failure, unspecified eye: Secondary | ICD-10-CM | POA: Diagnosis not present

## 2020-12-02 DIAGNOSIS — Z79899 Other long term (current) drug therapy: Secondary | ICD-10-CM | POA: Diagnosis not present

## 2020-12-02 DIAGNOSIS — X58XXXA Exposure to other specified factors, initial encounter: Secondary | ICD-10-CM | POA: Diagnosis not present

## 2020-12-13 DIAGNOSIS — T868412 Corneal transplant failure, left eye: Secondary | ICD-10-CM | POA: Diagnosis not present

## 2020-12-13 DIAGNOSIS — H1812 Bullous keratopathy, left eye: Secondary | ICD-10-CM | POA: Diagnosis not present

## 2020-12-13 DIAGNOSIS — R739 Hyperglycemia, unspecified: Secondary | ICD-10-CM | POA: Diagnosis not present

## 2020-12-13 DIAGNOSIS — Z8619 Personal history of other infectious and parasitic diseases: Secondary | ICD-10-CM | POA: Diagnosis not present

## 2020-12-13 DIAGNOSIS — Y772 Prosthetic and other implants, materials and accessory ophthalmic devices associated with adverse incidents: Secondary | ICD-10-CM | POA: Diagnosis not present

## 2020-12-13 DIAGNOSIS — H59012 Keratopathy (bullous aphakic) following cataract surgery, left eye: Secondary | ICD-10-CM | POA: Diagnosis not present

## 2020-12-13 DIAGNOSIS — Z947 Corneal transplant status: Secondary | ICD-10-CM | POA: Diagnosis not present

## 2020-12-13 DIAGNOSIS — K219 Gastro-esophageal reflux disease without esophagitis: Secondary | ICD-10-CM | POA: Diagnosis not present

## 2020-12-30 DIAGNOSIS — M5136 Other intervertebral disc degeneration, lumbar region: Secondary | ICD-10-CM | POA: Diagnosis not present

## 2020-12-30 DIAGNOSIS — Z6833 Body mass index (BMI) 33.0-33.9, adult: Secondary | ICD-10-CM | POA: Diagnosis not present

## 2020-12-30 DIAGNOSIS — N200 Calculus of kidney: Secondary | ICD-10-CM | POA: Diagnosis not present

## 2020-12-30 DIAGNOSIS — M545 Low back pain, unspecified: Secondary | ICD-10-CM | POA: Diagnosis not present

## 2020-12-30 DIAGNOSIS — E6609 Other obesity due to excess calories: Secondary | ICD-10-CM | POA: Diagnosis not present

## 2021-01-03 ENCOUNTER — Other Ambulatory Visit (HOSPITAL_COMMUNITY): Payer: Self-pay | Admitting: Family Medicine

## 2021-01-03 ENCOUNTER — Other Ambulatory Visit (HOSPITAL_BASED_OUTPATIENT_CLINIC_OR_DEPARTMENT_OTHER): Payer: Self-pay | Admitting: Family Medicine

## 2021-01-03 DIAGNOSIS — M545 Low back pain, unspecified: Secondary | ICD-10-CM

## 2021-01-17 ENCOUNTER — Other Ambulatory Visit: Payer: Self-pay

## 2021-01-17 ENCOUNTER — Ambulatory Visit (HOSPITAL_COMMUNITY)
Admission: RE | Admit: 2021-01-17 | Discharge: 2021-01-17 | Disposition: A | Discharge status: Home / Self Care | Payer: Medicare HMO | Source: Ambulatory Visit | Attending: Family Medicine | Admitting: Family Medicine

## 2021-01-17 DIAGNOSIS — M545 Low back pain, unspecified: Secondary | ICD-10-CM | POA: Diagnosis not present

## 2021-02-06 DIAGNOSIS — Z947 Corneal transplant status: Secondary | ICD-10-CM | POA: Insufficient documentation

## 2021-03-09 DIAGNOSIS — J029 Acute pharyngitis, unspecified: Secondary | ICD-10-CM | POA: Diagnosis not present

## 2021-03-09 DIAGNOSIS — Z23 Encounter for immunization: Secondary | ICD-10-CM | POA: Diagnosis not present

## 2021-03-09 DIAGNOSIS — E669 Obesity, unspecified: Secondary | ICD-10-CM | POA: Diagnosis not present

## 2021-03-09 DIAGNOSIS — Z6834 Body mass index (BMI) 34.0-34.9, adult: Secondary | ICD-10-CM | POA: Diagnosis not present

## 2021-03-12 ENCOUNTER — Other Ambulatory Visit (INDEPENDENT_AMBULATORY_CARE_PROVIDER_SITE_OTHER): Payer: Self-pay | Admitting: Gastroenterology

## 2021-03-12 DIAGNOSIS — K219 Gastro-esophageal reflux disease without esophagitis: Secondary | ICD-10-CM

## 2021-03-13 NOTE — Telephone Encounter (Signed)
Needs office visit.

## 2021-04-08 ENCOUNTER — Other Ambulatory Visit (INDEPENDENT_AMBULATORY_CARE_PROVIDER_SITE_OTHER): Payer: Self-pay | Admitting: Gastroenterology

## 2021-04-08 DIAGNOSIS — K219 Gastro-esophageal reflux disease without esophagitis: Secondary | ICD-10-CM

## 2021-04-13 ENCOUNTER — Encounter (INDEPENDENT_AMBULATORY_CARE_PROVIDER_SITE_OTHER): Payer: Self-pay | Admitting: Gastroenterology

## 2021-04-13 ENCOUNTER — Telehealth (INDEPENDENT_AMBULATORY_CARE_PROVIDER_SITE_OTHER): Payer: Medicare HMO | Admitting: Gastroenterology

## 2021-04-13 ENCOUNTER — Other Ambulatory Visit: Payer: Self-pay

## 2021-04-13 VITALS — Ht 71.0 in | Wt 238.0 lb

## 2021-04-13 DIAGNOSIS — J029 Acute pharyngitis, unspecified: Secondary | ICD-10-CM | POA: Diagnosis not present

## 2021-04-13 DIAGNOSIS — E6609 Other obesity due to excess calories: Secondary | ICD-10-CM | POA: Diagnosis not present

## 2021-04-13 DIAGNOSIS — E039 Hypothyroidism, unspecified: Secondary | ICD-10-CM | POA: Diagnosis not present

## 2021-04-13 DIAGNOSIS — K219 Gastro-esophageal reflux disease without esophagitis: Secondary | ICD-10-CM

## 2021-04-13 DIAGNOSIS — Z1331 Encounter for screening for depression: Secondary | ICD-10-CM | POA: Diagnosis not present

## 2021-04-13 DIAGNOSIS — R0989 Other specified symptoms and signs involving the circulatory and respiratory systems: Secondary | ICD-10-CM

## 2021-04-13 MED ORDER — OMEPRAZOLE 40 MG PO CPDR: 40.0000 mg | DELAYED_RELEASE_CAPSULE | Freq: Every day | ORAL | 3 refills | Status: DC

## 2021-04-13 NOTE — Patient Instructions (Signed)
-  Stop pantoprazole  -Start omeprazole 40mg  once daily, 30-45 minutes prior to eating( sent to your pharmacy) - call with update on symptoms in 1 month, or sooner if symptoms worsen. -If no improvement, will consider proceeding with EGD -continue to avoid trigger foods -stay upright 2-3 hours after eating, prior to lying down.  Follow up 1 year

## 2021-04-13 NOTE — Progress Notes (Addendum)
Primary Care Physician:  Cory Munch, PA-C  Primary GI: Jenetta Downer  Patient Location: Home   Provider Location: [place] [facility] office   Reason for Visit: GERD/thickness in throat   Persons present on the virtual encounter, with roles: Saraih Lorton L. Jolayne Branson, MSN, APRN, AGNP-C   Total time (minutes) spent on medical discussion: 44minutes  Virtual Visit via MyChart Video Note visit is conducted virtually and was requested by patient.   I connected with KOBI MARIO on 04/13/21 at  3:30 PM EST virtually via video and verified that I am speaking with the correct person using two identifiers.   I discussed the limitations, risks, security and privacy concerns of performing an evaluation and management service by telephone and the availability of in person appointments. I also discussed with the patient that there may be a patient responsible charge related to this service. The patient expressed understanding and agreed to proceed.  Chief Complaint  Patient presents with   Follow-up    Patient here today for a yearly follow on Gerd. He states was seen by pcp today and he was going to refer patient to ENT due to a feeling of something in throat. He is also wanting refills on Gerd medication.    History of Present Illness: Reginald Stephens is a 71 year old male with pmh GERD, CKD, Asthma, DM, HTN, HLD who presents for f/u of GERD.  Last seen Jan 2022, doing well at that time with GERD symptoms well managed on once daily protonix 40mg .   Currently maintained on protonix 40mg  once daily  Patient states that he has been having a sensation of a thickness/discomfort in his throat x2 months, saw PCP this morning and they advised him to mention it during visit today. PCP is referring him to ENT for further evaluation. States that acid reflux usually does pretty good on protonix 40mg  once daily, unless he eats late and lays down too soon. States that he has to watch what he eats, cannot eat dairy  and a lot of sweets. States that reflux is usually not problematic if he eats as he should.  he does not have any issues with dysphagia or foods getting stuck but that after he swallows he notices this somewhat of a thick feeling in his throat. States that he has had similar issues in the past and was told it was related to his reflux, was started on dexilant which resolved his symptoms. Denies sore throat or hoarseness, reports that he feels he has to clear some acid out of his throat upon waking. Has very occasional cough. Denies abdominal pain, rectal bleeding, melena, weight loss, changes in appetite.  weight at PCP today was 238.  Denies any nausea, or vomiting, no constipation but has some occasional diarrhea r/t metformin.   Last Colonoscopy: 09/23/2019 - diverticulosis  Last EGD: 2016-Non Critical schatzki's ring, Hiatal Hernia, Hiatus at 43cm, GE Junction at 40cm  Past Medical History:  Diagnosis Date   Arthritis    Asthma    Chronic kidney disease    Diabetes mellitus    GERD (gastroesophageal reflux disease)    History of kidney stones    Hyperlipidemia    Hypertension    Palpitations      Past Surgical History:  Procedure Laterality Date   COLONOSCOPY  02/15/2011   Procedure: COLONOSCOPY;  Surgeon: Rogene Houston, MD;  [facility];  Service: Endoscopy;  Laterality: N/A;   COLONOSCOPY N/A 09/23/2019   Procedure: COLONOSCOPY;  Surgeon: Rogene Houston, MD;  [facility];  Service: Endoscopy;  Laterality: N/A;  [place] N/A 07/08/2014   Procedure: ESOPHAGEAL DILATION;  Surgeon: Rogene Houston, MD;  [facility];  Service: Endoscopy;  Laterality: N/A;   ESOPHAGOGASTRODUODENOSCOPY N/A 07/08/2014   Procedure: ESOPHAGOGASTRODUODENOSCOPY (EGD);  Surgeon: Rogene Houston, MD;  [facility];  Service: Endoscopy;  Laterality: N/A;  245   ORIF ANKLE FRACTURE Left 03/19/2017   Procedure: OPEN REDUCTION INTERNAL  FIXATION (ORIF) LEFT ANKLE FRACTURE;  Surgeon: Carole Civil, MD;  Location: [facility];  Service: Orthopedics;  Laterality: Left;   TONSILLECTOMY     age 44   TONSILLECTOMY       Current Meds  Medication Sig   aspirin EC 81 MG tablet Take 81 mg by mouth daily.   atorvastatin (LIPITOR) 20 MG tablet Take 20 mg by mouth daily.   Cholecalciferol (VITAMIN D3) 50000 units CAPS Take 50,000 Units by mouth once a week. Friday   ferrous sulfate 325 (65 FE) MG tablet Take 325 mg by mouth daily with breakfast.   fluticasone (FLONASE) 50 MCG/ACT nasal spray Place 1 spray into both nostrils daily.    lisinopril-hydrochlorothiazide (PRINZIDE,ZESTORETIC) 20-25 MG per tablet Take 1 tablet by mouth daily.    metFORMIN (GLUCOPHAGE) 1000 MG tablet Take 1,000 mg by mouth 2 (two) times daily with a meal.   pantoprazole (PROTONIX) 40 MG tablet TAKE 1 TABLET (40 MG TOTAL) BY MOUTH DAILY. TAKE 30 MIN BEFORE BREAKFAST   TIADYLT ER 300 MG 24 hr capsule Take 300 mg by mouth daily.     Family History  Problem Relation Age of Onset   Heart attack Maternal Grandmother    Stroke Maternal Grandfather     Social History   Socioeconomic History   Marital status: Married    Spouse name: Not on file   Number of children: Not on file   Years of education: Not on file   Highest education level: Not on file  Occupational History   Not on file  Tobacco Use   Smoking status: Former    Packs/day: 1.00    Years: 25.00    Pack years: 25.00    Types: Cigarettes    Quit date: 03/19/2017    Years since quitting: 4.0   Smokeless tobacco: Never  Vaping Use   Vaping Use: Never used  Substance and Sexual Activity   Alcohol use: Yes    Alcohol/week: 12.0 standard drinks    Types: 12 Cans of beer per week   Drug use: No   Sexual activity: Yes    Birth control/protection: None  Other Topics Concern   Not on file  Social History Narrative   Not on file   Social Determinants of Health   Financial Resource  Strain: Not on file  Food Insecurity: Not on file  Transportation Needs: Not on file  Physical Activity: Not on file  Stress: Not on file  Social Connections: Not on file   Review of Systems: Gen: Denies fever, chills, anorexia. Denies fatigue, weakness, weight loss.  CV: Denies chest pain, palpitations, syncope, peripheral edema, and claudication. Resp: Denies dyspnea at rest, cough, wheezing, coughing up blood, and pleurisy. GI: see HPI Derm: Denies rash, itching, dry skin Psych: Denies depression, anxiety, memory loss, confusion. No homicidal or suicidal ideation.  Heme: Denies bruising, bleeding, and enlarged lymph nodes.  Observations/Objective: No distress. Unable to perform physical  exam due to telephone encounter. No video available.   Assessment and Plan: Truitt Cruey is a 71 year old male with pmh GERD, CKD, Asthma, DM, HTN, HLD who presents for f/u of GERD.  Presenting virtually today for follow up of GERD. States that for the past 1-2 months he has had a thickness/discomfort in his throat. He is also noticing some acid in his throat upon waking that he has to clear his throat to remove. Feels that heartburn is relatively controlled if he does not eat late or eat trigger foods, however, I suspect that the thick feeling/discomfort in his throat may be related to uncontrolled GERD. He has no dysphagia or odynophagia. He is without any alarm symptoms. Weight and appetite are good. Previously had similar symptoms that improved with dexilant, however, this medication remains not covered with his insurance. We will try omeprazole 40mg  once daily x1 month to see if there is any improvement in symptoms. I discussed proceeding with colonoscopy if symptoms do not improve after 1 month or if he has any worsening or new symptoms in the meantime, which he will let me know about. He should continue to avoid trigger foods and maintain reflux precautions with staying upright 2-3 hours after  eating. All questions were answered, patient verbalized understanding and is in agreement with plan as outline above.   -Stop pantoprazole  -Start omeprazole 40mg  once daily, 30-45 minutes prior to eating -Patient to call with update on symptoms in 1 month, or sooner if symptoms worsen. -If no improvement, will consider proceeding with EGD -reflux precautions  Follow Up Instructions: Follow up 1 year   I discussed the assessment and treatment plan with the patient. The patient was provided an opportunity to ask questions and all were answered. The patient agreed with the plan and demonstrated an understanding of the instructions.   The patient was advised to call back or seek an in-person evaluation if the symptoms worsen or if the condition fails to improve as anticipated.  I provided 15 minutes of face-to-face time during this MyChart Video encounter.  Neeka Urista L. Alver Sorrow, MSN, APRN, AGNP-C Adult-Gerontology Nurse Practitioner [facility]

## 2021-04-27 ENCOUNTER — Other Ambulatory Visit (INDEPENDENT_AMBULATORY_CARE_PROVIDER_SITE_OTHER): Payer: Self-pay | Admitting: Gastroenterology

## 2021-05-01 DIAGNOSIS — Z9889 Other specified postprocedural states: Secondary | ICD-10-CM | POA: Diagnosis not present

## 2021-05-01 DIAGNOSIS — H18451 Nodular corneal degeneration, right eye: Secondary | ICD-10-CM | POA: Diagnosis not present

## 2021-05-01 DIAGNOSIS — Z947 Corneal transplant status: Secondary | ICD-10-CM | POA: Diagnosis not present

## 2021-05-01 DIAGNOSIS — Z8669 Personal history of other diseases of the nervous system and sense organs: Secondary | ICD-10-CM | POA: Diagnosis not present

## 2021-05-01 DIAGNOSIS — H2511 Age-related nuclear cataract, right eye: Secondary | ICD-10-CM | POA: Diagnosis not present

## 2021-05-01 DIAGNOSIS — Z961 Presence of intraocular lens: Secondary | ICD-10-CM | POA: Diagnosis not present

## 2021-05-01 DIAGNOSIS — H40033 Anatomical narrow angle, bilateral: Secondary | ICD-10-CM | POA: Diagnosis not present

## 2021-05-01 NOTE — Telephone Encounter (Signed)
Noted  

## 2021-06-12 ENCOUNTER — Ambulatory Visit (INDEPENDENT_AMBULATORY_CARE_PROVIDER_SITE_OTHER): Payer: Medicare HMO | Admitting: Gastroenterology

## 2021-08-02 DIAGNOSIS — H2511 Age-related nuclear cataract, right eye: Secondary | ICD-10-CM | POA: Diagnosis not present

## 2021-08-02 DIAGNOSIS — Z947 Corneal transplant status: Secondary | ICD-10-CM | POA: Diagnosis not present

## 2021-08-02 DIAGNOSIS — H40033 Anatomical narrow angle, bilateral: Secondary | ICD-10-CM | POA: Diagnosis not present

## 2021-08-02 DIAGNOSIS — Z961 Presence of intraocular lens: Secondary | ICD-10-CM | POA: Diagnosis not present

## 2021-08-02 DIAGNOSIS — Z8669 Personal history of other diseases of the nervous system and sense organs: Secondary | ICD-10-CM | POA: Diagnosis not present

## 2021-08-02 DIAGNOSIS — Z9889 Other specified postprocedural states: Secondary | ICD-10-CM | POA: Diagnosis not present

## 2021-08-02 DIAGNOSIS — H18451 Nodular corneal degeneration, right eye: Secondary | ICD-10-CM | POA: Diagnosis not present

## 2021-08-31 DIAGNOSIS — R0989 Other specified symptoms and signs involving the circulatory and respiratory systems: Secondary | ICD-10-CM | POA: Diagnosis not present

## 2021-08-31 DIAGNOSIS — K219 Gastro-esophageal reflux disease without esophagitis: Secondary | ICD-10-CM | POA: Diagnosis not present

## 2021-08-31 DIAGNOSIS — R0683 Snoring: Secondary | ICD-10-CM | POA: Diagnosis not present

## 2021-10-05 DIAGNOSIS — E7849 Other hyperlipidemia: Secondary | ICD-10-CM | POA: Diagnosis not present

## 2021-10-05 DIAGNOSIS — E6609 Other obesity due to excess calories: Secondary | ICD-10-CM | POA: Diagnosis not present

## 2021-10-05 DIAGNOSIS — I10 Essential (primary) hypertension: Secondary | ICD-10-CM | POA: Diagnosis not present

## 2021-10-05 DIAGNOSIS — E782 Mixed hyperlipidemia: Secondary | ICD-10-CM | POA: Diagnosis not present

## 2021-10-05 DIAGNOSIS — E1129 Type 2 diabetes mellitus with other diabetic kidney complication: Secondary | ICD-10-CM | POA: Diagnosis not present

## 2021-10-05 DIAGNOSIS — Z125 Encounter for screening for malignant neoplasm of prostate: Secondary | ICD-10-CM | POA: Diagnosis not present

## 2021-11-07 ENCOUNTER — Ambulatory Visit: Payer: Medicare HMO | Admitting: Urology

## 2021-11-21 ENCOUNTER — Ambulatory Visit: Payer: Medicare HMO | Admitting: Urology

## 2021-12-11 DIAGNOSIS — Z947 Corneal transplant status: Secondary | ICD-10-CM | POA: Diagnosis not present

## 2021-12-11 DIAGNOSIS — H40033 Anatomical narrow angle, bilateral: Secondary | ICD-10-CM | POA: Diagnosis not present

## 2021-12-11 DIAGNOSIS — H18451 Nodular corneal degeneration, right eye: Secondary | ICD-10-CM | POA: Diagnosis not present

## 2021-12-11 DIAGNOSIS — Z961 Presence of intraocular lens: Secondary | ICD-10-CM | POA: Diagnosis not present

## 2021-12-11 DIAGNOSIS — H02402 Unspecified ptosis of left eyelid: Secondary | ICD-10-CM | POA: Diagnosis not present

## 2021-12-11 DIAGNOSIS — H2511 Age-related nuclear cataract, right eye: Secondary | ICD-10-CM | POA: Diagnosis not present

## 2021-12-11 DIAGNOSIS — Z79899 Other long term (current) drug therapy: Secondary | ICD-10-CM | POA: Diagnosis not present

## 2021-12-12 ENCOUNTER — Ambulatory Visit (INDEPENDENT_AMBULATORY_CARE_PROVIDER_SITE_OTHER): Payer: Medicare HMO | Admitting: Urology

## 2021-12-12 ENCOUNTER — Encounter: Payer: Self-pay | Admitting: Urology

## 2021-12-12 VITALS — BP 116/73 | HR 64 | Ht 71.0 in | Wt 240.0 lb

## 2021-12-12 DIAGNOSIS — N5201 Erectile dysfunction due to arterial insufficiency: Secondary | ICD-10-CM

## 2021-12-12 DIAGNOSIS — N200 Calculus of kidney: Secondary | ICD-10-CM | POA: Diagnosis not present

## 2021-12-12 DIAGNOSIS — Z87442 Personal history of urinary calculi: Secondary | ICD-10-CM

## 2021-12-12 NOTE — Progress Notes (Signed)
History of Present Illness: Here for followup of urolithiasis.  He has had no stone episodes over the past year.  He has had no blood in his urine.  Initial/last stone treatment was with lithotripsy over 10 years ago by Dr. Janice Norrie.  Past Medical History:  Diagnosis Date   Arthritis    Asthma    Chronic kidney disease    Diabetes mellitus    GERD (gastroesophageal reflux disease)    History of kidney stones    Hyperlipidemia    Hypertension    Palpitations     Past Surgical History:  Procedure Laterality Date   COLONOSCOPY  02/15/2011   Procedure: COLONOSCOPY;  Surgeon: Rogene Houston, MD;  [facility];  Service: Endoscopy;  Laterality: N/A;   COLONOSCOPY N/A 09/23/2019   Procedure: COLONOSCOPY;  Surgeon: Rogene Houston, MD;  [facility];  Service: Endoscopy;  Laterality: N/A;  [place] N/A 07/08/2014   Procedure: ESOPHAGEAL DILATION;  Surgeon: Rogene Houston, MD;  [facility];  Service: Endoscopy;  Laterality: N/A;   ESOPHAGOGASTRODUODENOSCOPY N/A 07/08/2014   Procedure: ESOPHAGOGASTRODUODENOSCOPY (EGD);  Surgeon: Rogene Houston, MD;  [facility];  Service: Endoscopy;  Laterality: N/A;  245   ORIF ANKLE FRACTURE Left 03/19/2017   Procedure: OPEN REDUCTION INTERNAL FIXATION (ORIF) LEFT ANKLE FRACTURE;  Surgeon: Carole Civil, MD;  Location: [facility];  Service: Orthopedics;  Laterality: Left;   TONSILLECTOMY     age 71   TONSILLECTOMY      Home Medications:  Allergies as of 12/12/2021       Reactions   Shellfish Allergy Nausea And Vomiting        Medication List        Accurate as of December 12, 2021  8:22 AM. If you have any questions, ask your nurse or doctor.          aspirin EC 81 MG tablet Take 81 mg by mouth daily.   atorvastatin 20 MG tablet Commonly known as: LIPITOR Take 20 mg by mouth daily.   ferrous sulfate 325 (65 FE) MG tablet Take 325 mg by mouth daily with  breakfast.   fluticasone 50 MCG/ACT nasal spray Commonly known as: FLONASE Place 1 spray into both nostrils daily.   lisinopril-hydrochlorothiazide 20-25 MG tablet Commonly known as: ZESTORETIC Take 1 tablet by mouth daily.   metFORMIN 1000 MG tablet Commonly known as: GLUCOPHAGE Take 1,000 mg by mouth 2 (two) times daily with a meal.   omeprazole 40 MG capsule Commonly known as: PRILOSEC TAKE 1 CAPSULE (40 MG TOTAL) BY MOUTH DAILY.   Tiadylt ER 300 MG 24 hr capsule Generic drug: diltiazem Take 300 mg by mouth daily.   Vitamin D3 1.25 MG (50000 UT) Caps Take 50,000 Units by mouth once a week. Friday        Allergies:  Allergies  Allergen Reactions   Shellfish Allergy Nausea And Vomiting    Family History  Problem Relation Age of Onset   Heart attack Maternal Grandmother    Stroke Maternal Grandfather     Social History:  reports that he quit smoking about 4 years ago. His smoking use included cigarettes. He has a 25.00 pack-year smoking history. He has never used smokeless tobacco. He reports current alcohol use of about 12.0 standard drinks of alcohol per week. He reports that he does not use drugs.  ROS: A complete review of systems was  performed.  All systems are negative except for pertinent findings as noted.  Physical Exam:  Vital signs in last 24 hours: There were no vitals taken for this visit. Constitutional:  Alert and oriented, No acute distress Cardiovascular: Regular rate  Respiratory: Normal respiratory effort Neurologic: Grossly intact, no focal deficits Psychiatric: Normal mood and affect  I have reviewed prior pt notes  I have reviewed urinalysis results  I have independently reviewed prior imaging--KUB from last year and CT scan from 2018 reviewed with the patient.  Prior AUS notes reviewed    Impression/Assessment:  Left lower pole stone, fairly large, stable between 2018 and 2022 on CT/KUB.  Asymptomatic now  Plan:  I will send  him out for KUB  Continue yearly follow-up unless significant growth

## 2021-12-13 LAB — URINALYSIS, ROUTINE W REFLEX MICROSCOPIC
Bilirubin, UA: NEGATIVE
Glucose, UA: NEGATIVE
Leukocytes,UA: NEGATIVE
Nitrite, UA: NEGATIVE
Specific Gravity, UA: 1.025 (ref 1.005–1.030)
Urobilinogen, Ur: 1 mg/dL (ref 0.2–1.0)
pH, UA: 5 (ref 5.0–7.5)

## 2021-12-13 LAB — MICROSCOPIC EXAMINATION: Bacteria, UA: NONE SEEN

## 2021-12-29 ENCOUNTER — Ambulatory Visit (HOSPITAL_COMMUNITY)
Admission: RE | Admit: 2021-12-29 | Discharge: 2021-12-29 | Disposition: A | Discharge status: Home / Self Care | Payer: Medicare HMO | Source: Ambulatory Visit | Attending: Urology | Admitting: Urology

## 2021-12-29 DIAGNOSIS — N200 Calculus of kidney: Secondary | ICD-10-CM

## 2021-12-29 DIAGNOSIS — M419 Scoliosis, unspecified: Secondary | ICD-10-CM | POA: Diagnosis not present

## 2021-12-29 DIAGNOSIS — I878 Other specified disorders of veins: Secondary | ICD-10-CM | POA: Diagnosis not present

## 2022-01-18 DIAGNOSIS — L281 Prurigo nodularis: Secondary | ICD-10-CM | POA: Diagnosis not present

## 2022-01-18 DIAGNOSIS — L738 Other specified follicular disorders: Secondary | ICD-10-CM | POA: Diagnosis not present

## 2022-02-23 DIAGNOSIS — E6609 Other obesity due to excess calories: Secondary | ICD-10-CM | POA: Diagnosis not present

## 2022-02-23 DIAGNOSIS — Z1331 Encounter for screening for depression: Secondary | ICD-10-CM | POA: Diagnosis not present

## 2022-02-23 DIAGNOSIS — E782 Mixed hyperlipidemia: Secondary | ICD-10-CM | POA: Diagnosis not present

## 2022-02-23 DIAGNOSIS — E1129 Type 2 diabetes mellitus with other diabetic kidney complication: Secondary | ICD-10-CM | POA: Diagnosis not present

## 2022-02-23 DIAGNOSIS — Z6834 Body mass index (BMI) 34.0-34.9, adult: Secondary | ICD-10-CM | POA: Diagnosis not present

## 2022-02-23 DIAGNOSIS — I10 Essential (primary) hypertension: Secondary | ICD-10-CM | POA: Diagnosis not present

## 2022-02-23 DIAGNOSIS — E119 Type 2 diabetes mellitus without complications: Secondary | ICD-10-CM | POA: Diagnosis not present

## 2022-02-23 DIAGNOSIS — Z0001 Encounter for general adult medical examination with abnormal findings: Secondary | ICD-10-CM | POA: Diagnosis not present

## 2022-02-28 ENCOUNTER — Other Ambulatory Visit (INDEPENDENT_AMBULATORY_CARE_PROVIDER_SITE_OTHER): Payer: Self-pay | Admitting: Gastroenterology

## 2022-03-08 ENCOUNTER — Telehealth: Payer: Self-pay | Admitting: Orthopedic Surgery

## 2022-03-08 DIAGNOSIS — M25511 Pain in right shoulder: Secondary | ICD-10-CM | POA: Diagnosis not present

## 2022-03-08 DIAGNOSIS — S42301A Unspecified fracture of shaft of humerus, right arm, initial encounter for closed fracture: Secondary | ICD-10-CM | POA: Diagnosis not present

## 2022-03-08 NOTE — Telephone Encounter (Signed)
Returned a call to [place] w/[facility] regarding this patient, she was not available, spoke to the lady who answered.  She wanted to schedule this patient w/out sending [place] the records and then send the records.  I told her we would need the records prior to scheduling and she said that is not the way Dr. Jomarie Longs does it.  I told her that was our providers and office protocol.  I told her if she would send the records we would call him to schedule.  I did let her know that the patient only wanted to see Dr. Aline Brochure and currently his first available is 03/23/22 and if he would consider seeing someone else we could get him in sooner.  She did ask that we call her when we get the pt scheduled w/the appt day/time.  The receptionist asked who our office manager was and I gave her [facility] name and she said that she knows her.  Prior to me making the call above the patient presented to the office wanting to be seen today.  Angela Nevin explained he couldn't be seen today and we need his records.  She did try to speak with Dr. Aline Brochure, but he was w/a pt.  The patient said what if he falls out, today,  Angela Nevin told him if he feels that bad to go to the emergency room.

## 2022-03-09 ENCOUNTER — Encounter: Payer: Self-pay | Admitting: Orthopedic Surgery

## 2022-03-09 ENCOUNTER — Ambulatory Visit (INDEPENDENT_AMBULATORY_CARE_PROVIDER_SITE_OTHER): Payer: Medicare HMO | Admitting: Orthopedic Surgery

## 2022-03-09 ENCOUNTER — Telehealth: Payer: Self-pay | Admitting: Orthopedic Surgery

## 2022-03-09 VITALS — BP 136/71 | HR 64 | Ht 71.0 in | Wt 239.8 lb

## 2022-03-09 DIAGNOSIS — S46009A Unspecified injury of muscle(s) and tendon(s) of the rotator cuff of unspecified shoulder, initial encounter: Secondary | ICD-10-CM

## 2022-03-09 NOTE — Progress Notes (Signed)
New problem new patient last seen January 2020  Chief Complaint  Patient presents with   New Patient (Initial Visit)   Shoulder Pain    RT shoulder/humerus DOI 02/27/22  PT fell onto shoulder after tripping on discharge chute on riding lawn mower. Landed on shoulder on concrete floor    History  72 year old male fell in his stool [place] tripping over the lawn more the day after Christmas.  He was able to move his arm after that but has had continuous dull aching pain in the right deltoid.  He is employed at [employer] and is returned to work.  He says he cannot sleep on his right side and he has painful forward elevation of the right arm as well  He is a diabetic he is no longer a smoker  Review of systems he listed light sensitivity and everything else normal  He is on lisinopril and some other medications as well.  He was seen by Dr. Jomarie Longs who took an x-ray and was concerned there may be a small cortical break in the proximal humerus.  I reviewed those x-rays they include 3 views of the humerus and I do not see a fracture or dislocation or bone lesion  BP 136/71   Pulse 64   Ht '5\' 11"'$  (1.803 m)   Wt 239 lb 12.8 oz (108.8 kg)   BMI 33.45 kg/m   Physical Exam Vitals and nursing note reviewed.  Constitutional:      Appearance: Normal appearance.  HENT:     Head: Normocephalic and atraumatic.  Eyes:     General: No scleral icterus.       Right eye: No discharge.        Left eye: No discharge.     Extraocular Movements: Extraocular movements intact.     Conjunctiva/sclera: Conjunctivae normal.     Pupils: Pupils are equal, round, and reactive to light.  Cardiovascular:     Rate and Rhythm: Normal rate.     Pulses: Normal pulses.  Skin:    General: Skin is warm and dry.     Capillary Refill: Capillary refill takes less than 2 seconds.  Neurological:     General: No focal deficit present.     Mental Status: He is alert and oriented to person, place, and time.  Psychiatric:         Mood and Affect: Mood normal.        Behavior: Behavior normal.        Thought Content: Thought content normal.        Judgment: Judgment normal.    Right shoulder skin I do not see any bruising or ecchymosis  As far as tenderness goes he is nontender over the [facility] joint he is nontender over the anterior deltoid  He is tender over the lateral deltoid  He has active abduction to 90 degrees flexion in the scapular plane 120 degrees and passive range of motion of 150 degrees  He had good strength in abduction but he had grade 3 out of 5 weakness in empty can position  Impingement sign was equivocal  He was stable in abduction external rotation with some pain  Impression  Encounter Diagnosis  Name Primary?   Rotator cuff injury, initial encounter Yes    Plan  Lifting restrictions no more than 10 pounds at work keep arm 90 degrees or less in terms of flexion  MRI right shoulder  Follow-up after MRI

## 2022-03-09 NOTE — Patient Instructions (Signed)
Central Scheduling [id number]  While we are working on your approval please go ahead and call to schedule your appointment to be done within one week. If you can not get an appointment at [facility] within the next week, ask if they have something sooner at [facility] or [facility] if you are able to go to [place] to have the imaging done.  AFTER you have made your imaging appointment, please call our office back at [phone] to schedule an appointment to review your results.

## 2022-03-09 NOTE — Telephone Encounter (Signed)
[place] called and left voicemail stating he has his MRI set up for next Friday.  I called the patient back to schedule follow up with Dr. Aline Brochure, left voicemail to call us back

## 2022-03-09 NOTE — Telephone Encounter (Signed)
Returned Chief Executive Officer at [facility]'s [phone]) call, she wanted to check and see if the patient got an appointment.  I advised that Dr. Aline Brochure reviewed the records and the patient was called late yesterday and given an appointment for today and was actually in the office being seen right now.

## 2022-03-12 NOTE — Telephone Encounter (Signed)
Noted  

## 2022-03-16 ENCOUNTER — Ambulatory Visit (HOSPITAL_COMMUNITY)
Admission: RE | Admit: 2022-03-16 | Discharge: 2022-03-16 | Disposition: A | Discharge status: Home / Self Care | Payer: Medicare HMO | Source: Ambulatory Visit | Attending: Orthopedic Surgery | Admitting: Orthopedic Surgery

## 2022-03-16 ENCOUNTER — Encounter (HOSPITAL_COMMUNITY): Payer: Self-pay

## 2022-03-16 DIAGNOSIS — S46009A Unspecified injury of muscle(s) and tendon(s) of the rotator cuff of unspecified shoulder, initial encounter: Secondary | ICD-10-CM

## 2022-03-19 ENCOUNTER — Telehealth: Payer: Self-pay | Admitting: Orthopedic Surgery

## 2022-03-19 NOTE — Telephone Encounter (Signed)
The patient called, left a message stating he was supposed to have his MRI yesterday, but he was dizzy and not able to do it.  He wants someone to call him to rescheduled and he'd like to go to [facility].  Pt's # [phone]

## 2022-03-20 NOTE — Telephone Encounter (Signed)
Message sent to April pait to [facility]

## 2022-03-21 ENCOUNTER — Telehealth: Payer: Self-pay | Admitting: Orthopedic Surgery

## 2022-03-21 NOTE — Telephone Encounter (Signed)
I called and gave him the number again. Left message

## 2022-03-21 NOTE — Telephone Encounter (Signed)
The patient has called and left another message that he wants to get rescheduled.  Pt's # [phone]

## 2022-03-22 ENCOUNTER — Ambulatory Visit: Payer: Medicare HMO | Admitting: Orthopedic Surgery

## 2022-03-28 ENCOUNTER — Telehealth: Payer: Self-pay | Admitting: Orthopedic Surgery

## 2022-03-28 NOTE — Telephone Encounter (Signed)
Patient called, lvm stated that he is scheduled for his MRI tomorrow and that Elvina Sidle told him Dr. Aline Brochure could prescribe something to help relax him and make him calm.  He wants it called to [facility] in [place].  Pt's # [phone]

## 2022-03-29 ENCOUNTER — Ambulatory Visit (HOSPITAL_COMMUNITY)
Admission: RE | Admit: 2022-03-29 | Discharge: 2022-03-29 | Disposition: A | Discharge status: Home / Self Care | Payer: Medicare HMO | Source: Ambulatory Visit | Attending: Orthopedic Surgery | Admitting: Orthopedic Surgery

## 2022-03-29 ENCOUNTER — Other Ambulatory Visit: Payer: Self-pay | Admitting: Orthopedic Surgery

## 2022-03-29 DIAGNOSIS — R600 Localized edema: Secondary | ICD-10-CM | POA: Diagnosis not present

## 2022-03-29 DIAGNOSIS — S46009A Unspecified injury of muscle(s) and tendon(s) of the rotator cuff of unspecified shoulder, initial encounter: Secondary | ICD-10-CM | POA: Diagnosis not present

## 2022-03-29 DIAGNOSIS — M75121 Complete rotator cuff tear or rupture of right shoulder, not specified as traumatic: Secondary | ICD-10-CM | POA: Diagnosis not present

## 2022-03-29 DIAGNOSIS — F40240 Claustrophobia: Secondary | ICD-10-CM

## 2022-03-29 MED ORDER — ALPRAZOLAM 0.25 MG PO TABS: ORAL_TABLET | ORAL | 0 refills | Status: DC

## 2022-03-29 NOTE — Progress Notes (Signed)
Meds ordered this encounter  Medications   ALPRAZolam (XANAX) 0.25 MG tablet    Sig: Take 30 min prior to mri    Dispense:  1 tablet    Refill:  0

## 2022-04-01 ENCOUNTER — Other Ambulatory Visit (INDEPENDENT_AMBULATORY_CARE_PROVIDER_SITE_OTHER): Payer: Self-pay | Admitting: Gastroenterology

## 2022-04-05 ENCOUNTER — Ambulatory Visit: Payer: Medicare HMO | Admitting: Orthopedic Surgery

## 2022-04-05 DIAGNOSIS — S46009A Unspecified injury of muscle(s) and tendon(s) of the rotator cuff of unspecified shoulder, initial encounter: Secondary | ICD-10-CM

## 2022-04-05 DIAGNOSIS — M75111 Incomplete rotator cuff tear or rupture of right shoulder, not specified as traumatic: Secondary | ICD-10-CM

## 2022-04-05 NOTE — Patient Instructions (Signed)
Physical therapy has been ordered for you at [facility] They should call you to schedule, [phone]  is the phone number to call, if you want to call to schedule.   

## 2022-04-05 NOTE — Progress Notes (Signed)
Follow-up MRI results  Chief Complaint  Patient presents with   Follow-up    Recheck on right shoulder    Reginald Stephens is a 72 year old male he fell around Christmas time tripping over a lawn mower  He was seen at urgent care thought to have a proximal humerus fracture but clinical exam suggest a rotator cuff tear so he was sent for imaging  His MRI showed that he had a small rotator cuff tear  He says that his shoulder is actually doing fairly well but it does bother him at night and sometimes when he lies in the bed  My interpretation of his MRI is that he has a small rotator cuff tear is maybe a centimeter to a centimeter and a half  Based on this scan his clinical exam which shows full forward elevation we have both decided to have him do therapy and see me after 4 to 5 weeks of therapy to see if the shoulder has improved

## 2022-04-12 ENCOUNTER — Ambulatory Visit (INDEPENDENT_AMBULATORY_CARE_PROVIDER_SITE_OTHER): Payer: Medicare HMO | Admitting: Gastroenterology

## 2022-04-12 ENCOUNTER — Encounter: Payer: Self-pay | Admitting: *Deleted

## 2022-04-12 ENCOUNTER — Encounter (INDEPENDENT_AMBULATORY_CARE_PROVIDER_SITE_OTHER): Payer: Self-pay | Admitting: Gastroenterology

## 2022-04-12 ENCOUNTER — Other Ambulatory Visit: Payer: Self-pay | Admitting: *Deleted

## 2022-04-12 VITALS — BP 137/80 | HR 71 | Temp 97.1°F | Ht 71.0 in | Wt 236.0 lb

## 2022-04-12 DIAGNOSIS — K219 Gastro-esophageal reflux disease without esophagitis: Secondary | ICD-10-CM | POA: Diagnosis not present

## 2022-04-12 DIAGNOSIS — R09A2 Foreign body sensation, throat: Secondary | ICD-10-CM | POA: Diagnosis not present

## 2022-04-12 MED ORDER — OMEPRAZOLE 40 MG PO CPDR: 40.0000 mg | DELAYED_RELEASE_CAPSULE | Freq: Two times a day (BID) | ORAL | 3 refills | Status: DC

## 2022-04-12 NOTE — Progress Notes (Signed)
Reginald Stephens, M.D. Gastroenterology & Hepatology [facility] [address] [place], [place] [address]  Primary Care Physician: Sharilyn Sites, MD [address] [place] [address]  I will communicate my assessment and recommendations to the referring MD via EMR.  Problems: GERD Globus sensation  History of Present Illness: Reginald Stephens is a 72 y.o. male with past medical history of GERD, diabetes, hyperlipidemia, hypertension, history of kidney stones, asthma and globus sensation who presents for follow up of GERD and globus sensation.  The patient was last seen on 04/13/2021. At that time, the patient was switched to omeprazole 40 mg every day.  Patient reports he was seen by an ENT a year ago - does not remember the name of the doctor. States he had a laryngoscopy which was normal. Was recommended to increase his omeprazole 40 mg to twice a day. Felt better in terms of having "less thickness sensation in the back of his throat". However, he only took it for some time, and now is back on once a day - states his throat is worse. He may have heartburn once a month but no often. No odynophagia or dysphagia. Takes omeprazole 2-3 hours prior to breakfast.  The patient denies having any nausea, vomiting, fever, chills, hematochezia, melena, hematemesis, abdominal distention, abdominal pain, diarrhea, jaundice, pruritus or weight loss.  Last Colonoscopy: 09/23/2019 - diverticulosis  Last EGD: 2016-Non Critical schatzki's ring, Hiatal Hernia, Hiatus at 43cm, GE [place] at 40cm  Past Medical History: Past Medical History:  Diagnosis Date   Arthritis    Asthma    Chronic kidney disease    Diabetes mellitus    GERD (gastroesophageal reflux disease)    History of kidney stones    Hyperlipidemia    Hypertension    Palpitations     Past Surgical History: Past Surgical History:  Procedure Laterality Date   COLONOSCOPY  02/15/2011    Procedure: COLONOSCOPY;  Surgeon: Rogene Houston, MD;  [facility];  Service: Endoscopy;  Laterality: N/A;   COLONOSCOPY N/A 09/23/2019   Procedure: COLONOSCOPY;  Surgeon: Rogene Houston, MD;  [facility];  Service: Endoscopy;  Laterality: N/A;  [place] N/A 07/08/2014   Procedure: ESOPHAGEAL DILATION;  Surgeon: Rogene Houston, MD;  [facility];  Service: Endoscopy;  Laterality: N/A;   ESOPHAGOGASTRODUODENOSCOPY N/A 07/08/2014   Procedure: ESOPHAGOGASTRODUODENOSCOPY (EGD);  Surgeon: Rogene Houston, MD;  [facility];  Service: Endoscopy;  Laterality: N/A;  245   ORIF ANKLE FRACTURE Left 03/19/2017   Procedure: OPEN REDUCTION INTERNAL FIXATION (ORIF) LEFT ANKLE FRACTURE;  Surgeon: Carole Civil, MD;  Location: [facility];  Service: Orthopedics;  Laterality: Left;   TONSILLECTOMY     age 28   TONSILLECTOMY      Family History: Family History  Problem Relation Age of Onset   Heart attack Maternal Grandmother    Stroke Maternal Grandfather     Social History: Social History   Tobacco Use  Smoking Status Former   Packs/day: 1.00   Years: 25.00   Total pack years: 25.00   Types: Cigarettes   Quit date: 03/19/2017   Years since quitting: 5.0  Smokeless Tobacco Never   Social History   Substance and Sexual Activity  Alcohol Use Yes   Alcohol/week: 12.0 standard drinks of alcohol   Types: 12 Cans of beer per week   Social History  Substance and Sexual Activity  Drug Use No    Allergies: Allergies  Allergen Reactions   Shellfish Allergy Nausea And Vomiting    Medications: Current Outpatient Medications  Medication Sig Dispense Refill   aspirin EC 81 MG tablet Take 81 mg by mouth daily.     atorvastatin (LIPITOR) 20 MG tablet Take 20 mg by mouth daily.     Cholecalciferol (VITAMIN D3) 50000 units CAPS Take 50,000 Units by mouth once a week. Friday  11   ferrous sulfate 325 (65 FE) MG  tablet Take 325 mg by mouth daily with breakfast.     fluticasone (FLONASE) 50 MCG/ACT nasal spray Place 1 spray into both nostrils daily.   1   JANUVIA 100 MG tablet Take 100 mg by mouth daily.     lisinopril-hydrochlorothiazide (PRINZIDE,ZESTORETIC) 20-25 MG per tablet Take 1 tablet by mouth daily.      metFORMIN (GLUCOPHAGE) 1000 MG tablet Take 1,000 mg by mouth 2 (two) times daily with a meal.     omeprazole (PRILOSEC) 40 MG capsule TAKE 1 CAPSULE (40 MG TOTAL) BY MOUTH DAILY. 90 capsule 0   prednisoLONE acetate (PRED FORTE) 1 % ophthalmic suspension Place 1 drop into the left eye 2 (two) times daily.     sodium chloride (MURO 128) 5 % ophthalmic ointment Place 1 Application into the left eye at bedtime as needed.     TIADYLT ER 300 MG 24 hr capsule Take 300 mg by mouth daily.     No current facility-administered medications for this visit.    Review of Systems: GENERAL: negative for malaise, night sweats HEENT: No changes in hearing or vision, no nose bleeds or other nasal problems. NECK: Negative for lumps, goiter, pain and significant neck swelling RESPIRATORY: Negative for cough, wheezing CARDIOVASCULAR: Negative for chest pain, leg swelling, palpitations, orthopnea GI: SEE HPI MUSCULOSKELETAL: Negative for joint pain or swelling, back pain, and muscle pain. SKIN: Negative for lesions, rash PSYCH: Negative for sleep disturbance, mood disorder and recent psychosocial stressors. HEMATOLOGY Negative for prolonged bleeding, bruising easily, and swollen nodes. ENDOCRINE: Negative for cold or heat intolerance, polyuria, polydipsia and goiter. NEURO: negative for tremor, gait imbalance, syncope and seizures. The remainder of the review of systems is noncontributory.   Physical Exam: BP 137/80 (BP Location: Left Arm, Patient Position: Sitting, Cuff Size: Large)   Pulse 71   Temp (!) 97.1 F (36.2 C) (Temporal)   Ht '5\' 11"'$  (1.803 m)   Wt 236 lb (107 kg)   BMI 32.92 kg/m  GENERAL:  The patient is AO x3, in no acute distress. HEENT: Head is normocephalic and atraumatic. EOMI are intact. Mouth is well hydrated and without lesions. NECK: Supple. No masses LUNGS: Clear to auscultation. No presence of rhonchi/wheezing/rales. Adequate chest expansion HEART: RRR, normal s1 and s2. ABDOMEN: Soft, nontender, no guarding, no peritoneal signs, and nondistended. BS +. No masses. EXTREMITIES: Without any cyanosis, clubbing, rash, lesions or edema. NEUROLOGIC: AOx3, no focal motor deficit. SKIN: no jaundice, no rashes  Imaging/Labs: as above  I personally reviewed and interpreted the available labs, imaging and endoscopic files.  Impression and Plan: Reginald Stephens is a 72 y.o. male with past medical history of GERD, diabetes, hyperlipidemia, hypertension, history of kidney stones, asthma and globus sensation who presents for follow up of GERD and globus sensation.  The patient has presented improvement in his reflux symptoms with very seldom heartburn episodes.  However, he has noted persistent discomfort in his throat.  Is unclear  if this is related to ongoing reflux episodes or hypersensitivity in his esophagus as he is mainly presenting throat clearing.  He noted having improvement of his symptoms while taking a PPI twice a day -will try this again, I will send a prescription for this today.  The patient and I held a thorough discussion about potential nonpharmacologic treatments for reflux which include Transoral Incisionless Fundoplication (TIF).  The benefits and risks, as well as prognosis with the use of the different modalities was thoroughly discussed with the patient who understood and agreed.  The patient will read more about this procedure and will let me know if interested to pursue this in the future.  - Schedule EGD - Increase omeprazole to 40 mg twice a day  All questions were answered.      Reginald Peppers, MD Gastroenterology and Hepatology [facility]

## 2022-04-12 NOTE — Patient Instructions (Signed)
Schedule EGD Increase omeprazole to 40 mg twice a day The patient and I held a thorough discussion about potential nonpharmacologic treatments for reflux which include Transoral Incisionless Fundoplication (TIF).  The benefits and risks, as well as prognosis with the use of the different modalities was thoroughly discussed with the patient who understood and agreed.  The patient will read more about this procedure and will let me know if interested to pursue this in the future.

## 2022-04-13 ENCOUNTER — Encounter: Payer: Self-pay | Admitting: *Deleted

## 2022-04-16 ENCOUNTER — Telehealth: Payer: Self-pay | Admitting: *Deleted

## 2022-04-16 NOTE — Telephone Encounter (Signed)
Reginald Stephens, Butch Penny, Marinda Tyer L, LPN Pt called and states he wants to cancel.  I advised him to call your office.  I have canceled the procedure.  Thanks, Hoyle Sauer

## 2022-04-16 NOTE — Telephone Encounter (Signed)
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 He says once things get settled down he will call back to reschedule. FYI

## 2022-04-16 NOTE — Telephone Encounter (Signed)
Thanks Tammy 

## 2022-04-19 DIAGNOSIS — M25611 Stiffness of right shoulder, not elsewhere classified: Secondary | ICD-10-CM | POA: Diagnosis not present

## 2022-04-19 DIAGNOSIS — M25511 Pain in right shoulder: Secondary | ICD-10-CM | POA: Diagnosis not present

## 2022-04-19 DIAGNOSIS — R293 Abnormal posture: Secondary | ICD-10-CM | POA: Diagnosis not present

## 2022-04-19 DIAGNOSIS — M62511 Muscle wasting and atrophy, not elsewhere classified, right shoulder: Secondary | ICD-10-CM | POA: Diagnosis not present

## 2022-04-26 DIAGNOSIS — M25511 Pain in right shoulder: Secondary | ICD-10-CM | POA: Diagnosis not present

## 2022-04-26 DIAGNOSIS — R293 Abnormal posture: Secondary | ICD-10-CM | POA: Diagnosis not present

## 2022-04-26 DIAGNOSIS — M25611 Stiffness of right shoulder, not elsewhere classified: Secondary | ICD-10-CM | POA: Diagnosis not present

## 2022-04-26 DIAGNOSIS — M62511 Muscle wasting and atrophy, not elsewhere classified, right shoulder: Secondary | ICD-10-CM | POA: Diagnosis not present

## 2022-05-03 ENCOUNTER — Encounter: Payer: Self-pay | Admitting: Radiology

## 2022-05-04 DIAGNOSIS — E6609 Other obesity due to excess calories: Secondary | ICD-10-CM | POA: Diagnosis not present

## 2022-05-04 DIAGNOSIS — Z6833 Body mass index (BMI) 33.0-33.9, adult: Secondary | ICD-10-CM | POA: Diagnosis not present

## 2022-05-04 DIAGNOSIS — S80819A Abrasion, unspecified lower leg, initial encounter: Secondary | ICD-10-CM | POA: Diagnosis not present

## 2022-05-04 DIAGNOSIS — E1129 Type 2 diabetes mellitus with other diabetic kidney complication: Secondary | ICD-10-CM | POA: Diagnosis not present

## 2022-05-10 DIAGNOSIS — H02831 Dermatochalasis of right upper eyelid: Secondary | ICD-10-CM | POA: Diagnosis not present

## 2022-05-10 DIAGNOSIS — H57813 Brow ptosis, bilateral: Secondary | ICD-10-CM | POA: Diagnosis not present

## 2022-05-10 DIAGNOSIS — H02422 Myogenic ptosis of left eyelid: Secondary | ICD-10-CM | POA: Diagnosis not present

## 2022-05-10 DIAGNOSIS — Z947 Corneal transplant status: Secondary | ICD-10-CM | POA: Diagnosis not present

## 2022-05-10 DIAGNOSIS — H0289 Other specified disorders of eyelid: Secondary | ICD-10-CM | POA: Diagnosis not present

## 2022-05-10 DIAGNOSIS — H02834 Dermatochalasis of left upper eyelid: Secondary | ICD-10-CM | POA: Diagnosis not present

## 2022-05-11 DIAGNOSIS — M62511 Muscle wasting and atrophy, not elsewhere classified, right shoulder: Secondary | ICD-10-CM | POA: Diagnosis not present

## 2022-05-11 DIAGNOSIS — R293 Abnormal posture: Secondary | ICD-10-CM | POA: Diagnosis not present

## 2022-05-11 DIAGNOSIS — M25611 Stiffness of right shoulder, not elsewhere classified: Secondary | ICD-10-CM | POA: Diagnosis not present

## 2022-05-11 DIAGNOSIS — M25511 Pain in right shoulder: Secondary | ICD-10-CM | POA: Diagnosis not present

## 2022-05-17 DIAGNOSIS — R293 Abnormal posture: Secondary | ICD-10-CM | POA: Diagnosis not present

## 2022-05-17 DIAGNOSIS — M25511 Pain in right shoulder: Secondary | ICD-10-CM | POA: Diagnosis not present

## 2022-05-17 DIAGNOSIS — M25611 Stiffness of right shoulder, not elsewhere classified: Secondary | ICD-10-CM | POA: Diagnosis not present

## 2022-05-17 DIAGNOSIS — M62511 Muscle wasting and atrophy, not elsewhere classified, right shoulder: Secondary | ICD-10-CM | POA: Diagnosis not present

## 2022-05-18 ENCOUNTER — Ambulatory Visit (HOSPITAL_COMMUNITY): Admit: 2022-05-18 | Payer: Medicare HMO | Admitting: Gastroenterology

## 2022-05-18 ENCOUNTER — Encounter (HOSPITAL_COMMUNITY): Payer: Self-pay

## 2022-05-18 SURGERY — ESOPHAGOGASTRODUODENOSCOPY (EGD) WITH PROPOFOL
Anesthesia: Monitor Anesthesia Care

## 2022-05-25 DIAGNOSIS — M62511 Muscle wasting and atrophy, not elsewhere classified, right shoulder: Secondary | ICD-10-CM | POA: Diagnosis not present

## 2022-05-25 DIAGNOSIS — M25611 Stiffness of right shoulder, not elsewhere classified: Secondary | ICD-10-CM | POA: Diagnosis not present

## 2022-05-25 DIAGNOSIS — R293 Abnormal posture: Secondary | ICD-10-CM | POA: Diagnosis not present

## 2022-05-25 DIAGNOSIS — M25511 Pain in right shoulder: Secondary | ICD-10-CM | POA: Diagnosis not present

## 2022-06-07 DIAGNOSIS — R293 Abnormal posture: Secondary | ICD-10-CM | POA: Diagnosis not present

## 2022-06-07 DIAGNOSIS — M25611 Stiffness of right shoulder, not elsewhere classified: Secondary | ICD-10-CM | POA: Diagnosis not present

## 2022-06-07 DIAGNOSIS — M25511 Pain in right shoulder: Secondary | ICD-10-CM | POA: Diagnosis not present

## 2022-06-07 DIAGNOSIS — M62511 Muscle wasting and atrophy, not elsewhere classified, right shoulder: Secondary | ICD-10-CM | POA: Diagnosis not present

## 2022-06-22 NOTE — Telephone Encounter (Signed)
LMTRC

## 2022-07-03 DIAGNOSIS — H0289 Other specified disorders of eyelid: Secondary | ICD-10-CM | POA: Diagnosis not present

## 2022-07-03 DIAGNOSIS — H02423 Myogenic ptosis of bilateral eyelids: Secondary | ICD-10-CM | POA: Diagnosis not present

## 2022-07-19 ENCOUNTER — Encounter: Payer: Self-pay | Admitting: *Deleted

## 2022-07-19 NOTE — Telephone Encounter (Signed)
Mailed letter °

## 2022-10-10 DIAGNOSIS — Z9889 Other specified postprocedural states: Secondary | ICD-10-CM | POA: Diagnosis not present

## 2022-10-10 DIAGNOSIS — H02402 Unspecified ptosis of left eyelid: Secondary | ICD-10-CM | POA: Diagnosis not present

## 2022-10-10 DIAGNOSIS — Z961 Presence of intraocular lens: Secondary | ICD-10-CM | POA: Diagnosis not present

## 2022-10-10 DIAGNOSIS — Z947 Corneal transplant status: Secondary | ICD-10-CM | POA: Diagnosis not present

## 2022-10-10 DIAGNOSIS — H2511 Age-related nuclear cataract, right eye: Secondary | ICD-10-CM | POA: Diagnosis not present

## 2022-10-10 DIAGNOSIS — H40033 Anatomical narrow angle, bilateral: Secondary | ICD-10-CM | POA: Diagnosis not present

## 2022-10-10 DIAGNOSIS — Z8669 Personal history of other diseases of the nervous system and sense organs: Secondary | ICD-10-CM | POA: Diagnosis not present

## 2022-12-19 ENCOUNTER — Ambulatory Visit: Payer: Medicare HMO | Admitting: Urology

## 2022-12-25 ENCOUNTER — Ambulatory Visit: Payer: Medicare HMO | Admitting: Urology

## 2023-01-09 ENCOUNTER — Other Ambulatory Visit (INDEPENDENT_AMBULATORY_CARE_PROVIDER_SITE_OTHER): Payer: Self-pay | Admitting: Gastroenterology

## 2023-01-09 DIAGNOSIS — K219 Gastro-esophageal reflux disease without esophagitis: Secondary | ICD-10-CM

## 2023-01-21 NOTE — Progress Notes (Signed)
History of Present Illness: Here for followup of urolithiasis.  He has had no stone episodes over the past year.  He has had no blood in his urine.  Initial/last stone treatment was with lithotripsy over 10 years ago by Dr. Brunilda Payor.  Past Medical History:  Diagnosis Date   Arthritis    Asthma    Chronic kidney disease    Diabetes mellitus    GERD (gastroesophageal reflux disease)    History of kidney stones    Hyperlipidemia    Hypertension    Palpitations     Past Surgical History:  Procedure Laterality Date   COLONOSCOPY  02/15/2011   Procedure: COLONOSCOPY;  Surgeon: Malissa Hippo, MD;  [facility];  Service: Endoscopy;  Laterality: N/A;   COLONOSCOPY N/A 09/23/2019   Procedure: COLONOSCOPY;  Surgeon: Malissa Hippo, MD;  [facility];  Service: Endoscopy;  Laterality: N/A;  730   CYSTOSCOPY  2011   ESOPHAGEAL DILATION N/A 07/08/2014   Procedure: ESOPHAGEAL DILATION;  Surgeon: Malissa Hippo, MD;  [facility];  Service: Endoscopy;  Laterality: N/A;   ESOPHAGOGASTRODUODENOSCOPY N/A 07/08/2014   Procedure: ESOPHAGOGASTRODUODENOSCOPY (EGD);  Surgeon: Malissa Hippo, MD;  [facility];  Service: Endoscopy;  Laterality: N/A;  245   ORIF ANKLE FRACTURE Left 03/19/2017   Procedure: OPEN REDUCTION INTERNAL FIXATION (ORIF) LEFT ANKLE FRACTURE;  Surgeon: Vickki Hearing, MD;  Location: [facility];  Service: Orthopedics;  Laterality: Left;   TONSILLECTOMY     age 73   TONSILLECTOMY      Home Medications:  Allergies as of 01/22/2023       Reactions   Shellfish Allergy Nausea And Vomiting        Medication List        Accurate as of January 21, 2023  6:52 PM. If you have any questions, ask your nurse or doctor.          aspirin EC 81 MG tablet Take 81 mg by mouth daily.   atorvastatin 20 MG tablet Commonly known as: LIPITOR Take 20 mg by mouth daily.   ferrous sulfate 325 (65 FE) MG tablet Take 325 mg by mouth daily with  breakfast.   fluticasone 50 MCG/ACT nasal spray Commonly known as: FLONASE Place 1 spray into both nostrils daily.   Januvia 100 MG tablet Generic drug: sitaGLIPtin Take 100 mg by mouth daily.   lisinopril-hydrochlorothiazide 20-25 MG tablet Commonly known as: ZESTORETIC Take 1 tablet by mouth daily.   metFORMIN 1000 MG tablet Commonly known as: GLUCOPHAGE Take 1,000 mg by mouth 2 (two) times daily with a meal.   omeprazole 40 MG capsule Commonly known as: PRILOSEC Take 1 capsule (40 mg total) by mouth 2 (two) times daily.   prednisoLONE acetate 1 % ophthalmic suspension Commonly known as: PRED FORTE Place 1 drop into the left eye 2 (two) times daily.   sodium chloride 5 % ophthalmic ointment Commonly known as: MURO 128 Place 1 Application into the left eye at bedtime as needed.   Tiadylt ER 300 MG 24 hr capsule Generic drug: diltiazem Take 300 mg by mouth daily.   Vitamin D3 1.25 MG (50000 UT) Caps Take 50,000 Units by mouth once a week. Friday        Allergies:  Allergies  Allergen Reactions   Shellfish Allergy Nausea And Vomiting    Family History  Problem Relation Age of Onset   Heart attack Maternal Grandmother    Stroke  Maternal Grandfather     Social History:  reports that he quit smoking about 5 years ago. His smoking use included cigarettes. He started smoking about 30 years ago. He has a 25 pack-year smoking history. He has never used smokeless tobacco. He reports current alcohol use of about 12.0 standard drinks of alcohol per week. He reports that he does not use drugs.  ROS: A complete review of systems was performed.  All systems are negative except for pertinent findings as noted.  Physical Exam:  Vital signs in last 24 hours: There were no vitals taken for this visit. Constitutional:  Alert and oriented, No acute distress Cardiovascular: Regular rate  Respiratory: Normal respiratory effort Neurologic: Grossly intact, no focal  deficits Psychiatric: Normal mood and affect  I have reviewed prior pt notes  I have reviewed urinalysis results  I have independently reviewed prior imaging--KUB from last year and CT scan from 2018 reviewed with the patient.  Prior AUS notes reviewed    Impression/Assessment:  Left lower pole stone, fairly large, stable between 2018 and 2022 on CT/KUB.  Asymptomatic now  Plan:  I will send him out for KUB  Continue yearly follow-up unless significant growth

## 2023-01-22 ENCOUNTER — Encounter: Payer: Self-pay | Admitting: Urology

## 2023-01-22 ENCOUNTER — Ambulatory Visit: Payer: Medicare HMO | Admitting: Urology

## 2023-01-22 VITALS — BP 126/73 | HR 73 | Ht 71.0 in | Wt 242.6 lb

## 2023-01-22 DIAGNOSIS — N200 Calculus of kidney: Secondary | ICD-10-CM

## 2023-01-22 DIAGNOSIS — N5201 Erectile dysfunction due to arterial insufficiency: Secondary | ICD-10-CM | POA: Diagnosis not present

## 2023-01-22 LAB — URINALYSIS, ROUTINE W REFLEX MICROSCOPIC
Bilirubin, UA: NEGATIVE
Ketones, UA: NEGATIVE
Nitrite, UA: NEGATIVE
Protein,UA: NEGATIVE
RBC, UA: NEGATIVE
Specific Gravity, UA: 1.025 (ref 1.005–1.030)
Urobilinogen, Ur: 2 mg/dL — ABNORMAL HIGH (ref 0.2–1.0)
pH, UA: 6 (ref 5.0–7.5)

## 2023-01-22 LAB — MICROSCOPIC EXAMINATION
Bacteria, UA: NONE SEEN
RBC, Urine: NONE SEEN /[HPF] (ref 0–2)

## 2023-01-25 ENCOUNTER — Ambulatory Visit (HOSPITAL_COMMUNITY)
Admission: RE | Admit: 2023-01-25 | Discharge: 2023-01-25 | Disposition: A | Discharge status: Home / Self Care | Payer: Medicare HMO | Source: Ambulatory Visit | Attending: Urology | Admitting: Urology

## 2023-01-25 DIAGNOSIS — N200 Calculus of kidney: Secondary | ICD-10-CM | POA: Diagnosis not present

## 2023-01-25 DIAGNOSIS — K5900 Constipation, unspecified: Secondary | ICD-10-CM | POA: Diagnosis not present

## 2023-02-04 ENCOUNTER — Telehealth: Payer: Self-pay | Admitting: Urology

## 2023-02-04 NOTE — Telephone Encounter (Signed)
Pt would like results from x ray

## 2023-02-04 NOTE — Telephone Encounter (Signed)
Patient requesting xray results

## 2023-02-05 NOTE — Telephone Encounter (Signed)
Patient called again about his xray results, he wants to know the status of the stones.    I explained to pt that we have not received the radiology report yet.

## 2023-02-05 NOTE — Telephone Encounter (Signed)
Pt called and told pre Dr. Retta Diones "results have not come back from radiologist yet. However, I looked @ image myself and it looks the same as last year"

## 2023-02-19 ENCOUNTER — Telehealth: Payer: Self-pay

## 2023-02-19 NOTE — Telephone Encounter (Signed)
Pt called to be made aware of his KUB results

## 2023-04-11 ENCOUNTER — Encounter (HOSPITAL_COMMUNITY): Payer: Self-pay | Admitting: *Deleted

## 2023-04-11 ENCOUNTER — Emergency Department (HOSPITAL_COMMUNITY): Payer: Medicare Other

## 2023-04-11 ENCOUNTER — Observation Stay (HOSPITAL_COMMUNITY)
Admission: EM | Admit: 2023-04-11 | Discharge: 2023-04-12 | Disposition: A | Discharge status: Home / Self Care | Payer: Medicare Other | Attending: Internal Medicine | Admitting: Internal Medicine

## 2023-04-11 ENCOUNTER — Other Ambulatory Visit: Payer: Self-pay

## 2023-04-11 DIAGNOSIS — J9601 Acute respiratory failure with hypoxia: Secondary | ICD-10-CM | POA: Insufficient documentation

## 2023-04-11 DIAGNOSIS — I129 Hypertensive chronic kidney disease with stage 1 through stage 4 chronic kidney disease, or unspecified chronic kidney disease: Secondary | ICD-10-CM | POA: Diagnosis not present

## 2023-04-11 DIAGNOSIS — J45909 Unspecified asthma, uncomplicated: Secondary | ICD-10-CM | POA: Diagnosis not present

## 2023-04-11 DIAGNOSIS — E1165 Type 2 diabetes mellitus with hyperglycemia: Secondary | ICD-10-CM | POA: Insufficient documentation

## 2023-04-11 DIAGNOSIS — J189 Pneumonia, unspecified organism: Secondary | ICD-10-CM | POA: Diagnosis not present

## 2023-04-11 DIAGNOSIS — Z79899 Other long term (current) drug therapy: Secondary | ICD-10-CM | POA: Insufficient documentation

## 2023-04-11 DIAGNOSIS — E119 Type 2 diabetes mellitus without complications: Secondary | ICD-10-CM

## 2023-04-11 DIAGNOSIS — I700 Atherosclerosis of aorta: Secondary | ICD-10-CM | POA: Insufficient documentation

## 2023-04-11 DIAGNOSIS — Z20822 Contact with and (suspected) exposure to covid-19: Secondary | ICD-10-CM | POA: Diagnosis not present

## 2023-04-11 DIAGNOSIS — E1122 Type 2 diabetes mellitus with diabetic chronic kidney disease: Secondary | ICD-10-CM | POA: Diagnosis not present

## 2023-04-11 DIAGNOSIS — F1090 Alcohol use, unspecified, uncomplicated: Secondary | ICD-10-CM | POA: Insufficient documentation

## 2023-04-11 DIAGNOSIS — Z7982 Long term (current) use of aspirin: Secondary | ICD-10-CM | POA: Insufficient documentation

## 2023-04-11 DIAGNOSIS — Z87891 Personal history of nicotine dependence: Secondary | ICD-10-CM | POA: Insufficient documentation

## 2023-04-11 DIAGNOSIS — Z7901 Long term (current) use of anticoagulants: Secondary | ICD-10-CM | POA: Insufficient documentation

## 2023-04-11 DIAGNOSIS — J1000 Influenza due to other identified influenza virus with unspecified type of pneumonia: Secondary | ICD-10-CM | POA: Insufficient documentation

## 2023-04-11 DIAGNOSIS — A419 Sepsis, unspecified organism: Principal | ICD-10-CM

## 2023-04-11 DIAGNOSIS — E785 Hyperlipidemia, unspecified: Secondary | ICD-10-CM | POA: Diagnosis not present

## 2023-04-11 DIAGNOSIS — I2510 Atherosclerotic heart disease of native coronary artery without angina pectoris: Secondary | ICD-10-CM | POA: Diagnosis not present

## 2023-04-11 DIAGNOSIS — K219 Gastro-esophageal reflux disease without esophagitis: Secondary | ICD-10-CM | POA: Diagnosis not present

## 2023-04-11 DIAGNOSIS — N189 Chronic kidney disease, unspecified: Secondary | ICD-10-CM | POA: Insufficient documentation

## 2023-04-11 DIAGNOSIS — Z6834 Body mass index (BMI) 34.0-34.9, adult: Secondary | ICD-10-CM | POA: Insufficient documentation

## 2023-04-11 DIAGNOSIS — E66811 Obesity, class 1: Secondary | ICD-10-CM | POA: Diagnosis not present

## 2023-04-11 DIAGNOSIS — I10 Essential (primary) hypertension: Secondary | ICD-10-CM | POA: Diagnosis not present

## 2023-04-11 DIAGNOSIS — Z7984 Long term (current) use of oral hypoglycemic drugs: Secondary | ICD-10-CM | POA: Diagnosis not present

## 2023-04-11 DIAGNOSIS — R509 Fever, unspecified: Secondary | ICD-10-CM | POA: Diagnosis present

## 2023-04-11 DIAGNOSIS — R6510 Systemic inflammatory response syndrome (SIRS) of non-infectious origin without acute organ dysfunction: Secondary | ICD-10-CM | POA: Diagnosis present

## 2023-04-11 LAB — CBC WITH DIFFERENTIAL/PLATELET
Abs Immature Granulocytes: 0.12 10*3/uL — ABNORMAL HIGH (ref 0.00–0.07)
Basophils Absolute: 0 10*3/uL (ref 0.0–0.1)
Basophils Relative: 0 %
Eosinophils Absolute: 0.2 10*3/uL (ref 0.0–0.5)
Eosinophils Relative: 1 %
HCT: 37.5 % — ABNORMAL LOW (ref 39.0–52.0)
Hemoglobin: 13.1 g/dL (ref 13.0–17.0)
Immature Granulocytes: 1 %
Lymphocytes Relative: 2 %
Lymphs Abs: 0.3 10*3/uL — ABNORMAL LOW (ref 0.7–4.0)
MCH: 35.4 pg — ABNORMAL HIGH (ref 26.0–34.0)
MCHC: 34.9 g/dL (ref 30.0–36.0)
MCV: 101.4 fL — ABNORMAL HIGH (ref 80.0–100.0)
Monocytes Absolute: 0.6 10*3/uL (ref 0.1–1.0)
Monocytes Relative: 5 %
Neutro Abs: 10.5 10*3/uL — ABNORMAL HIGH (ref 1.7–7.7)
Neutrophils Relative %: 91 %
Platelets: 251 10*3/uL (ref 150–400)
RBC: 3.7 MIL/uL — ABNORMAL LOW (ref 4.22–5.81)
RDW: 14.2 % (ref 11.5–15.5)
WBC: 11.7 10*3/uL — ABNORMAL HIGH (ref 4.0–10.5)
nRBC: 0 % (ref 0.0–0.2)

## 2023-04-11 LAB — RESPIRATORY PANEL BY PCR

## 2023-04-11 LAB — URINALYSIS, W/ REFLEX TO CULTURE (INFECTION SUSPECTED)
Bilirubin Urine: NEGATIVE
Glucose, UA: >=500 mg/dL — AB
Hgb urine dipstick: NEGATIVE
Ketones, ur: NEGATIVE mg/dL
Leukocytes,Ua: NEGATIVE
Nitrite: NEGATIVE
Protein, ur: NEGATIVE mg/dL
Specific Gravity, Urine: 1.021 (ref 1.005–1.030)
pH: 5 (ref 5.0–8.0)

## 2023-04-11 LAB — COMPREHENSIVE METABOLIC PANEL
ALT: 19 U/L (ref 0–44)
AST: 19 U/L (ref 15–41)
Albumin: 4 g/dL (ref 3.5–5.0)
Alkaline Phosphatase: 57 U/L (ref 38–126)
Anion gap: 11 (ref 5–15)
BUN: 15 mg/dL (ref 8–23)
CO2: 23 mmol/L (ref 22–32)
Calcium: 9.7 mg/dL (ref 8.9–10.3)
Chloride: 101 mmol/L (ref 98–111)
Creatinine, Ser: 0.87 mg/dL (ref 0.61–1.24)
GFR, Estimated: >60 mL/min (ref >60)
Glucose, Bld: 213 mg/dL — ABNORMAL HIGH (ref 70–99)
Potassium: 3 mmol/L — ABNORMAL LOW (ref 3.5–5.1)
Sodium: 135 mmol/L (ref 135–145)
Total Bilirubin: 0.9 mg/dL (ref 0.0–1.2)
Total Protein: 6.9 g/dL (ref 6.5–8.1)

## 2023-04-11 LAB — I-STAT CHEM 8, ED
BUN: 15 mg/dL (ref 8–23)
Calcium, Ion: 1.19 mmol/L (ref 1.15–1.40)
Chloride: 101 mmol/L (ref 98–111)
Creatinine, Ser: 0.8 mg/dL (ref 0.61–1.24)
Glucose, Bld: 214 mg/dL — ABNORMAL HIGH (ref 70–99)
HCT: 44 % (ref 39.0–52.0)
Hemoglobin: 15 g/dL (ref 13.0–17.0)
Potassium: 3.2 mmol/L — ABNORMAL LOW (ref 3.5–5.1)
Sodium: 137 mmol/L (ref 135–145)
TCO2: 23 mmol/L (ref 22–32)

## 2023-04-11 LAB — TROPONIN I (HIGH SENSITIVITY): Troponin I (High Sensitivity): 7 ng/L (ref <18)

## 2023-04-11 LAB — RESP PANEL BY RT-PCR (RSV, FLU A&B, COVID)  RVPGX2
Influenza A by PCR: NEGATIVE
Influenza B by PCR: NEGATIVE
Resp Syncytial Virus by PCR: NEGATIVE
SARS Coronavirus 2 by RT PCR: NEGATIVE

## 2023-04-11 LAB — HEMOGLOBIN A1C
Hgb A1c MFr Bld: 6.4 % — ABNORMAL HIGH (ref 4.8–5.6)
Mean Plasma Glucose: 136.98 mg/dL

## 2023-04-11 LAB — RAPID URINE DRUG SCREEN, HOSP PERFORMED
Amphetamines: NOT DETECTED
Barbiturates: NOT DETECTED
Benzodiazepines: NOT DETECTED
Cocaine: NOT DETECTED
Opiates: NOT DETECTED
Tetrahydrocannabinol: NOT DETECTED

## 2023-04-11 LAB — CBG MONITORING, ED
Glucose-Capillary: 197 mg/dL — ABNORMAL HIGH (ref 70–99)
Glucose-Capillary: 167 mg/dL — ABNORMAL HIGH (ref 70–99)
Glucose-Capillary: 171 mg/dL — ABNORMAL HIGH (ref 70–99)
Glucose-Capillary: 128 mg/dL — ABNORMAL HIGH (ref 70–99)

## 2023-04-11 LAB — PROCALCITONIN: Procalcitonin: 2.28 ng/mL

## 2023-04-11 MED ORDER — ONDANSETRON HCL 4 MG/2ML IJ SOLN: 4.0000 mg | Freq: Four times a day (QID) | INTRAMUSCULAR | Status: DC | PRN

## 2023-04-11 MED ORDER — INSULIN ASPART 100 UNIT/ML IJ SOLN
0.0000 [IU] | Freq: Three times a day (TID) | INTRAMUSCULAR | Status: DC
Start: 2023-04-11 — End: 2023-04-12
  Administered 2023-04-11: 2 [IU] via SUBCUTANEOUS
  Administered 2023-04-12: 1 [IU] via SUBCUTANEOUS
  Filled 2023-04-11: qty 1

## 2023-04-11 MED ORDER — SODIUM CHLORIDE 0.9 % IV SOLN
2.0000 g | INTRAVENOUS | Status: DC
  Administered 2023-04-12: 2 g via INTRAVENOUS
  Filled 2023-04-11: qty 20

## 2023-04-11 MED ORDER — IPRATROPIUM-ALBUTEROL 0.5-2.5 (3) MG/3ML IN SOLN
3.0000 mL | Freq: Four times a day (QID) | RESPIRATORY_TRACT | Status: DC | PRN
Start: 2023-04-11 — End: 2023-04-12

## 2023-04-11 MED ORDER — POLYVINYL ALCOHOL 1.4 % OP SOLN: 1.0000 [drp] | OPHTHALMIC | Status: DC | PRN

## 2023-04-11 MED ORDER — POTASSIUM CHLORIDE CRYS ER 20 MEQ PO TBCR
40.0000 meq | EXTENDED_RELEASE_TABLET | Freq: Once | ORAL | Status: AC
  Administered 2023-04-11: 40 meq via ORAL
  Filled 2023-04-11: qty 2

## 2023-04-11 MED ORDER — SODIUM CHLORIDE (HYPERTONIC) 5 % OP OINT: 1.0000 | TOPICAL_OINTMENT | Freq: Every evening | OPHTHALMIC | Status: DC | PRN

## 2023-04-11 MED ORDER — KETOROLAC TROMETHAMINE 15 MG/ML IJ SOLN
15.0000 mg | Freq: Once | INTRAMUSCULAR | Status: AC
  Administered 2023-04-11: 15 mg via INTRAVENOUS
  Filled 2023-04-11: qty 1

## 2023-04-11 MED ORDER — INSULIN ASPART 100 UNIT/ML IJ SOLN: 0.0000 [IU] | Freq: Every day | INTRAMUSCULAR | Status: DC

## 2023-04-11 MED ORDER — DM-GUAIFENESIN ER 30-600 MG PO TB12
1.0000 | ORAL_TABLET | Freq: Two times a day (BID) | ORAL | Status: DC
  Administered 2023-04-11 – 2023-04-12 (×3): 1 via ORAL
  Filled 2023-04-11 (×3): qty 1

## 2023-04-11 MED ORDER — ACETAMINOPHEN 650 MG RE SUPP: 650.0000 mg | Freq: Four times a day (QID) | RECTAL | Status: DC | PRN

## 2023-04-11 MED ORDER — FERROUS SULFATE 325 (65 FE) MG PO TABS
325.0000 mg | ORAL_TABLET | Freq: Every day | ORAL | Status: DC
  Administered 2023-04-12: 325 mg via ORAL
  Filled 2023-04-11: qty 1

## 2023-04-11 MED ORDER — ACETAMINOPHEN 325 MG PO TABS
650.0000 mg | ORAL_TABLET | Freq: Four times a day (QID) | ORAL | Status: DC | PRN
  Administered 2023-04-11 – 2023-04-12 (×2): 650 mg via ORAL
  Filled 2023-04-11 (×2): qty 2

## 2023-04-11 MED ORDER — PREDNISOLONE ACETATE 1 % OP SUSP
1.0000 [drp] | Freq: Two times a day (BID) | OPHTHALMIC | Status: DC
  Filled 2023-04-11: qty 1

## 2023-04-11 MED ORDER — ONDANSETRON HCL 4 MG PO TABS: 4.0000 mg | ORAL_TABLET | Freq: Four times a day (QID) | ORAL | Status: DC | PRN

## 2023-04-11 MED ORDER — LISINOPRIL-HYDROCHLOROTHIAZIDE 20-25 MG PO TABS: 1.0000 | ORAL_TABLET | Freq: Every day | ORAL | Status: DC

## 2023-04-11 MED ORDER — ATORVASTATIN CALCIUM 40 MG PO TABS
20.0000 mg | ORAL_TABLET | Freq: Every day | ORAL | Status: DC
  Administered 2023-04-11 – 2023-04-12 (×2): 20 mg via ORAL
  Filled 2023-04-11: qty 2
  Filled 2023-04-11: qty 1

## 2023-04-11 MED ORDER — IOHEXOL 300 MG/ML  SOLN
80.0000 mL | Freq: Once | INTRAMUSCULAR | Status: AC | PRN
  Administered 2023-04-11: 80 mL via INTRAVENOUS

## 2023-04-11 MED ORDER — SODIUM CHLORIDE 0.9 % IV SOLN
500.0000 mg | INTRAVENOUS | Status: DC
  Administered 2023-04-11 – 2023-04-12 (×2): 500 mg via INTRAVENOUS
  Filled 2023-04-11 (×2): qty 5

## 2023-04-11 MED ORDER — PANTOPRAZOLE SODIUM 40 MG PO TBEC
40.0000 mg | DELAYED_RELEASE_TABLET | Freq: Every day | ORAL | Status: DC
Start: 2023-04-11 — End: 2023-04-12
  Administered 2023-04-11 – 2023-04-12 (×2): 40 mg via ORAL
  Filled 2023-04-11 (×2): qty 1

## 2023-04-11 MED ORDER — HYDROCHLOROTHIAZIDE 25 MG PO TABS
25.0000 mg | ORAL_TABLET | Freq: Every day | ORAL | Status: DC
  Administered 2023-04-11 – 2023-04-12 (×2): 25 mg via ORAL
  Filled 2023-04-11 (×2): qty 1

## 2023-04-11 MED ORDER — ASPIRIN 81 MG PO TBEC
81.0000 mg | DELAYED_RELEASE_TABLET | Freq: Every day | ORAL | Status: DC
  Administered 2023-04-11 – 2023-04-12 (×2): 81 mg via ORAL
  Filled 2023-04-11 (×2): qty 1

## 2023-04-11 MED ORDER — ACETAMINOPHEN 325 MG PO TABS
650.0000 mg | ORAL_TABLET | Freq: Once | ORAL | Status: AC
  Administered 2023-04-11: 650 mg via ORAL
  Filled 2023-04-11: qty 2

## 2023-04-11 MED ORDER — LINAGLIPTIN 5 MG PO TABS
5.0000 mg | ORAL_TABLET | Freq: Every day | ORAL | Status: DC
  Administered 2023-04-11 – 2023-04-12 (×2): 5 mg via ORAL
  Filled 2023-04-11 (×2): qty 1

## 2023-04-11 MED ORDER — LISINOPRIL 10 MG PO TABS
20.0000 mg | ORAL_TABLET | Freq: Every day | ORAL | Status: DC
  Administered 2023-04-11 – 2023-04-12 (×2): 20 mg via ORAL
  Filled 2023-04-11 (×2): qty 2

## 2023-04-11 MED ORDER — FLUTICASONE PROPIONATE 50 MCG/ACT NA SUSP
1.0000 | Freq: Every day | NASAL | Status: DC | PRN
Start: 2023-04-11 — End: 2023-04-12

## 2023-04-11 MED ORDER — ENOXAPARIN SODIUM 40 MG/0.4ML IJ SOSY
40.0000 mg | PREFILLED_SYRINGE | INTRAMUSCULAR | Status: DC
  Administered 2023-04-11: 40 mg via SUBCUTANEOUS
  Filled 2023-04-11: qty 0.4

## 2023-04-11 MED ORDER — DILTIAZEM HCL ER COATED BEADS 300 MG PO CP24
300.0000 mg | ORAL_CAPSULE | Freq: Every day | ORAL | Status: DC
  Administered 2023-04-11 – 2023-04-12 (×2): 300 mg via ORAL
  Filled 2023-04-11 (×2): qty 1

## 2023-04-11 MED ORDER — SODIUM CHLORIDE 0.9 % IV SOLN
2.0000 g | Freq: Once | INTRAVENOUS | Status: AC
  Administered 2023-04-11: 2 g via INTRAVENOUS
  Filled 2023-04-11: qty 20

## 2023-04-11 NOTE — H&P (Signed)
 History and Physical    AZAR SOUTH FMW:[id number] DOB: [birth year] DOA: 04/11/2023  PCP: Marvine Rush, MD   Patient coming from: Home  Chief Complaint: Fever/confusion  HPI: Reginald Stephens is a 73 y.o. male with medical history significant for type 2 diabetes, GERD, dyslipidemia, hypertension, and GERD who presented to the ED with complaints of fever that began last night.  He was also noted to be more weak, fatigued, and confused this morning at which point he was brought to the emergency department.  He denies any nausea, vomiting, chest pain, shortness of breath, or diarrhea.  He states that he has had a productive cough and denies any sick contacts.  He otherwise continues to take his medications as prescribed.  Denies any current tobacco abuse and does drink 1-2 beers on a daily basis.   ED Course: Vital signs with temperature 101.6 F, otherwise stable.  Leukocytosis of 11,700 noted and potassium 3 and glucose 213.  Chest x-ray and urine analysis with no acute findings and UDS along with CT head negative.  CT chest demonstrating presence of right and left sided multifocal pneumonia.  Review of Systems: Reviewed as noted above, otherwise negative.  Past Medical History:  Diagnosis Date   Arthritis    Asthma    Chronic kidney disease    Diabetes mellitus    GERD (gastroesophageal reflux disease)    History of kidney stones    Hyperlipidemia    Hypertension    Palpitations     Past Surgical History:  Procedure Laterality Date   COLONOSCOPY  02/15/2011   Procedure: COLONOSCOPY;  Surgeon: Claudis RAYMOND Rivet, MD;  [facility];  Service: Endoscopy;  Laterality: N/A;   COLONOSCOPY N/A 09/23/2019   Procedure: COLONOSCOPY;  Surgeon: Rivet Claudis RAYMOND, MD;  [facility];  Service: Endoscopy;  Laterality: N/A;  730   CYSTOSCOPY  2011   ESOPHAGEAL DILATION N/A 07/08/2014   Procedure: ESOPHAGEAL DILATION;  Surgeon: Claudis RAYMOND Rivet, MD;  [facility];   Service: Endoscopy;  Laterality: N/A;   ESOPHAGOGASTRODUODENOSCOPY N/A 07/08/2014   Procedure: ESOPHAGOGASTRODUODENOSCOPY (EGD);  Surgeon: Claudis RAYMOND Rivet, MD;  [facility];  Service: Endoscopy;  Laterality: N/A;  245   ORIF ANKLE FRACTURE Left 03/19/2017   Procedure: OPEN REDUCTION INTERNAL FIXATION (ORIF) LEFT ANKLE FRACTURE;  Surgeon: Margrette Taft BRAVO, MD;  Location: [facility];  Service: Orthopedics;  Laterality: Left;   TONSILLECTOMY     age 59   TONSILLECTOMY       reports that he quit smoking about 6 years ago. His smoking use included cigarettes. He started smoking about 31 years ago. He has a 25 pack-year smoking history. He has never used smokeless tobacco. He reports current alcohol  use of about 7.0 standard drinks of alcohol  per week. He reports that he does not use drugs.  Allergies  Allergen Reactions   Shellfish Allergy Nausea And Vomiting    Family History  Problem Relation Age of Onset   Heart attack Maternal Grandmother    Stroke Maternal Grandfather     Prior to Admission medications   Medication Sig Start Date End Date Taking? Authorizing Provider  aspirin  EC 81 MG tablet Take 81 mg by mouth daily.    [provider]  atorvastatin  (LIPITOR) 20 MG tablet Take 20 mg by mouth daily.    [provider]  Cholecalciferol (VITAMIN D3) 50000 units CAPS Take 50,000 Units by mouth once a week. Friday 12/18/16  [provider]  ferrous sulfate  325 (65 FE) MG tablet Take 325 mg by mouth daily with breakfast.    [provider]  fluticasone  (FLONASE ) 50 MCG/ACT nasal spray Place 1 spray into both nostrils daily.  02/23/17   [provider]  JANUVIA 100 MG tablet Take 100 mg by mouth daily. 12/26/21   [provider]  lisinopril -hydrochlorothiazide  (PRINZIDE ,ZESTORETIC ) 20-25 MG per tablet Take 1 tablet by mouth daily.  08/10/12   [provider]  metFORMIN (GLUCOPHAGE) 1000 MG tablet Take 1,000 mg by mouth 2  (two) times daily with a meal.    [provider]  omeprazole  (PRILOSEC) 40 MG capsule Take 1 capsule (40 mg total) by mouth 2 (two) times daily. 01/09/23   [name], MD  prednisoLONE  acetate (PRED FORTE ) 1 % ophthalmic suspension Place 1 drop into the left eye 2 (two) times daily.    [provider]  sodium chloride  (MURO 128) 5 % ophthalmic ointment Place 1 Application into the left eye at bedtime as needed.    [provider]  TIADYLT  ER 300 MG 24 hr capsule Take 300 mg by mouth daily. 08/17/19   [provider]    Physical Exam: Vitals:   04/11/23 1015 04/11/23 1042 04/11/23 1045 04/11/23 1100  BP: 134/83  125/68 131/71  Pulse: 92 89 92 88  Resp: (!) 28 15 14 20   Temp:  (!) 101.6 F (38.7 C)    TempSrc:  Rectal    SpO2: 95% 95% 92% 94%  Weight:      Height:        Constitutional: NAD, calm, comfortable Vitals:   04/11/23 1015 04/11/23 1042 04/11/23 1045 04/11/23 1100  BP: 134/83  125/68 131/71  Pulse: 92 89 92 88  Resp: (!) 28 15 14 20   Temp:  (!) 101.6 F (38.7 C)    TempSrc:  Rectal    SpO2: 95% 95% 92% 94%  Weight:      Height:       Eyes: lids and conjunctivae normal Neck: normal, supple Respiratory: clear to auscultation bilaterally. Normal respiratory effort. No accessory muscle use.  Cardiovascular: Regular rate and rhythm, no murmurs. Abdomen: no tenderness, no distention. Bowel sounds positive.  Musculoskeletal:  No edema. Skin: no rashes, lesions, ulcers.  Psychiatric: Flat affect  Labs on Admission: I have personally reviewed following labs and imaging studies  CBC: Recent Labs  Lab 04/11/23 0920 04/11/23 0929  WBC  --  11.7*  NEUTROABS  --  10.5*  HGB 15.0 13.1  HCT 44.0 37.5*  MCV  --  101.4*  PLT  --  251   Basic Metabolic Panel: Recent Labs  Lab 04/11/23 0920 04/11/23 0929  NA 137 135  K 3.2* 3.0*  CL 101 101  CO2  --  23  GLUCOSE 214* 213*  BUN 15 15  CREATININE 0.80 0.87   CALCIUM   --  9.7   GFR: Estimated Creatinine Clearance: 96.3 mL/min (by C-G formula based on SCr of 0.87 mg/dL). Liver Function Tests: Recent Labs  Lab 04/11/23 0929  AST 19  ALT 19  ALKPHOS 57  BILITOT 0.9  PROT 6.9  ALBUMIN 4.0   No results for input(s): LIPASE, AMYLASE in the last 168 hours. No results for input(s): AMMONIA in the last 168 hours. Coagulation Profile: No results for input(s): INR, PROTIME in the last 168 hours. Cardiac Enzymes: No results for input(s): CKTOTAL, CKMB, CKMBINDEX, TROPONINI in the last 168 hours. BNP (last 3  results) No results for input(s): PROBNP in the last 8760 hours. HbA1C: No results for input(s): HGBA1C in the last 72 hours. CBG: Recent Labs  Lab 04/11/23 0909 04/11/23 1131  GLUCAP 197* 167*   Lipid Profile: No results for input(s): CHOL, HDL, LDLCALC, TRIG, CHOLHDL, LDLDIRECT in the last 72 hours. Thyroid  Function Tests: No results for input(s): TSH, T4TOTAL, FREET4, T3FREE, THYROIDAB in the last 72 hours. Anemia Panel: No results for input(s): VITAMINB12, FOLATE, FERRITIN, TIBC, IRON, RETICCTPCT in the last 72 hours. Urine analysis:    Component Value Date/Time   COLORURINE YELLOW 04/11/2023 1030   APPEARANCEUR CLEAR 04/11/2023 1030   APPEARANCEUR Clear 01/22/2023 1322   LABSPEC 1.021 04/11/2023 1030   PHURINE 5.0 04/11/2023 1030   GLUCOSEU >=500 (A) 04/11/2023 1030   HGBUR NEGATIVE 04/11/2023 1030   BILIRUBINUR NEGATIVE 04/11/2023 1030   BILIRUBINUR Negative 01/22/2023 1322   KETONESUR NEGATIVE 04/11/2023 1030   PROTEINUR NEGATIVE 04/11/2023 1030   UROBILINOGEN 0.2 05/10/2013 1130   NITRITE NEGATIVE 04/11/2023 1030   LEUKOCYTESUR NEGATIVE 04/11/2023 1030    Radiological Exams on Admission: CT Chest W Contrast Result Date: 04/11/2023 CLINICAL DATA:  Respiratory illness, nondiagnostic xray.  Cough. EXAM: CT CHEST WITH CONTRAST TECHNIQUE: Multidetector CT imaging  of the chest was performed during intravenous contrast administration. RADIATION DOSE REDUCTION: This exam was performed according to the departmental dose-optimization program which includes automated exposure control, adjustment of the mA and/or kV according to patient size and/or use of iterative reconstruction technique. CONTRAST:  80mL OMNIPAQUE  IOHEXOL  300 MG/ML  SOLN COMPARISON:  Chest x-ray today FINDINGS: Cardiovascular: Heart is normal size. Aorta is normal caliber. Three vessel coronary artery disease. Scattered aortic calcifications. Mediastinum/Nodes: No mediastinal, hilar, or axillary adenopathy. Trachea and esophagus are unremarkable. Thyroid  unremarkable. Lungs/Pleura: Patchy ground-glass opacities in the right lung and left lower lobe could reflect early pneumonia. No effusions. Upper Abdomen: No acute findings. Musculoskeletal: Mild bilateral gynecomastia. No acute bony abnormality. IMPRESSION: Patchy ground-glass opacities throughout the right lung and in the left lower lobe could reflect early multifocal pneumonia. Three-vessel coronary artery disease. Aortic Atherosclerosis (ICD10-I70.0). Electronically Signed   By: Franky Crease M.D.   On: 04/11/2023 12:25   DG Chest 1 View Result Date: 04/11/2023 CLINICAL DATA:  73 year old male with cough. Left nipple shadow versus lung nodule. EXAM: CHEST  1 VIEW COMPARISON:  Chest radiographs 0930 hours today. FINDINGS: Upright PA view at 0958 hours with nipple markers. Left nipple shadow confirmed. Lung volumes and mediastinal contours stable and within normal limits. Lungs appear stable, negative. No acute osseous abnormality identified. Negative visible bowel gas. IMPRESSION: Confirmed left nipple shadow.  No acute cardiopulmonary abnormality. Electronically Signed   By: VEAR Hurst M.D.   On: 04/11/2023 10:29   CT Head Wo Contrast Result Date: 04/11/2023 CLINICAL DATA:  Altered mental status.  Right-sided weakness. EXAM: CT HEAD WITHOUT CONTRAST  TECHNIQUE: Contiguous axial images were obtained from the base of the skull through the vertex without intravenous contrast. RADIATION DOSE REDUCTION: This exam was performed according to the departmental dose-optimization program which includes automated exposure control, adjustment of the mA and/or kV according to patient size and/or use of iterative reconstruction technique. COMPARISON:  None Available. FINDINGS: Brain: No evidence of acute infarction, hemorrhage, hydrocephalus, extra-axial collection or mass lesion/mass effect. Vascular: Atherosclerotic vascular calcification of the carotid siphons. No hyperdense vessel. Skull: Normal. Negative for fracture or focal lesion. Sinuses/Orbits: No acute finding. Other: None. IMPRESSION: 1. No acute intracranial abnormality. Electronically Signed   By:  Elsie ONEIDA Shoulder M.D.   On: 04/11/2023 10:13   DG Chest 2 View Result Date: 04/11/2023 CLINICAL DATA:  Disorientation, weakness to right side, cough EXAM: CHEST - 2 VIEW COMPARISON:  03/19/2017 FINDINGS: Stable cardiomediastinal silhouette. Aortic atherosclerotic calcification. Ill-defined nodular opacity projecting over the left lower lung is favored to represent a nipple shadow. No focal consolidation, pleural effusion, or pneumothorax. No displaced rib fractures. IMPRESSION: 1. No acute cardiopulmonary disease. 2. Ill-defined nodular opacity projecting over the left lower lung is favored to represent a nipple shadow. Consider repeat radiograph with nipple markers for confirmation. Electronically Signed   By: Norman Gatlin M.D.   On: 04/11/2023 09:42    EKG: Independently reviewed. SR 91bpm.  Assessment/Plan Principal Problem:   Sepsis due to pneumonia [facility]) Active Problems:   Essential hypertension   Hyperlipidemia   Diabetes (HCC)   Gastroesophageal reflux disease without esophagitis    Sepsis, POA, secondary to bilateral multifocal pneumonia -Started on Rocephin  and azithromycin , continue  same -Procalcitonin 2.28 -Check respiratory viral panel -Continue to monitor a.m. labs  Mild hypokalemia -Replete and reevaluate in a.m.  Type 2 diabetes with hyperglycemia -Hold home medications and maintain on SSI -Carb modified diet  Dyslipidemia -Continue statin  Hypertension -Continue home BP medications  GERD -PPI  Obesity, class I -BMI 34.44  Alcohol  abuse -Drinks 1-2 beers on a daily basis -No current signs of withdrawal, consider CIWA as needed   DVT prophylaxis: Lovenox  Code Status: Full Family Communication: Spouse at bedside 2/6 Disposition Plan: Admit for pneumonia Consults called: None Admission status: Inpatient, telemetry  Severity of Illness: The appropriate patient status for this patient is INPATIENT. Inpatient status is judged to be reasonable and necessary in order to provide the required intensity of service to ensure the patient's safety. The patient's presenting symptoms, physical exam findings, and initial radiographic and laboratory data in the context of their chronic comorbidities is felt to place them at high risk for further clinical deterioration. Furthermore, it is not anticipated that the patient will be medically stable for discharge from the hospital within 2 midnights of admission.   * I certify that at the point of admission it is my clinical judgment that the patient will require inpatient hospital care spanning beyond 2 midnights from the point of admission due to high intensity of service, high risk for further deterioration and high frequency of surveillance required.*   Demorris Choyce D Shawan Tosh DO [facility]  If 7PM-7AM, please contact night-coverage [email or web address]  04/11/2023, 1:10 PM

## 2023-04-11 NOTE — ED Triage Notes (Signed)
 Pt reports waking up around 0530 this morning and felt disoriented and had weakness to the right side. LKW last night at 2230 when he went to bed. Pt oriented to self, place, and situation. Disoriented to time currently. Reports it is April of 2024. Unsure of who the president is. Wife and pt report he would normally know the answer to these questions. Denies weakness at this time.

## 2023-04-11 NOTE — ED Provider Notes (Signed)
 [place] EMERGENCY DEPARTMENT AT [facility] Provider Note   CSN: [id number] Arrival date & time: 04/11/23  9165     History  Chief Complaint  Patient presents with   Altered Mental Status    Reginald Stephens is a 73 y.o. male.  HPI 73 year old male presents with altered mental status and weakness.  History is from patient as well as the wife.  He went to bed around 10:30 PM last night feeling fine.  Woke up at 5:30 AM and felt disoriented.  When asked to further describe this he felt lightheaded.  Wife noticed him trying to fold towels but was having a difficult time.  He was try to put on his shoes but putting them on the wrong feet.  He denies a headache but was found to have a temp of 100 when EMS arrived.  He has had a cough since yesterday.  No headache, vomiting, urinary symptoms.  He feels all over a week though he told triage she had felt right sided weakness which was not noticed by the wife.  The patient was more disoriented in triage though currently is still answering some disorientation questions wrong and does not know what month it is. Wife feels like he is from earlier but still confused from his baseline  Home Medications Prior to Admission medications   Medication Sig Start Date End Date Taking? Authorizing Provider  aspirin  EC 81 MG tablet Take 81 mg by mouth daily.    [provider]  atorvastatin  (LIPITOR) 20 MG tablet Take 20 mg by mouth daily.    [provider]  Cholecalciferol (VITAMIN D3) 50000 units CAPS Take 50,000 Units by mouth once a week. Friday 12/18/16   [provider]  ferrous sulfate  325 (65 FE) MG tablet Take 325 mg by mouth daily with breakfast.    [provider]  fluticasone  (FLONASE ) 50 MCG/ACT nasal spray Place 1 spray into both nostrils daily.  02/23/17   [provider]  JANUVIA 100 MG tablet Take 100 mg by mouth daily. 12/26/21   [provider]  lisinopril -hydrochlorothiazide   (PRINZIDE ,ZESTORETIC ) 20-25 MG per tablet Take 1 tablet by mouth daily.  08/10/12   [provider]  metFORMIN (GLUCOPHAGE) 1000 MG tablet Take 1,000 mg by mouth 2 (two) times daily with a meal.    [provider]  omeprazole  (PRILOSEC) 40 MG capsule Take 1 capsule (40 mg total) by mouth 2 (two) times daily. 01/09/23   Reginald Angelia Sieving, MD  prednisoLONE  acetate (PRED FORTE ) 1 % ophthalmic suspension Place 1 drop into the left eye 2 (two) times daily.    [provider]  sodium chloride  (MURO 128) 5 % ophthalmic ointment Place 1 Application into the left eye at bedtime as needed.    [provider]  TIADYLT  ER 300 MG 24 hr capsule Take 300 mg by mouth daily. 08/17/19   [provider]      Allergies    Shellfish allergy    Review of Systems   Review of Systems  Constitutional:  Positive for fever.  HENT:  Positive for sore throat.   Respiratory:  Positive for cough.   Cardiovascular:  Negative for chest pain.  Neurological:  Positive for weakness and light-headedness. Negative for headaches.  Psychiatric/Behavioral:  Positive for confusion.     Physical Exam Updated Vital Signs BP 131/71   Pulse 88   Temp (!) 101.6 F (38.7 C) (Rectal)   Resp 20   Ht  5' 10 (1.778 m)   Wt 108.9 kg   SpO2 94%   BMI 34.44 kg/m  Physical Exam Vitals and nursing note reviewed.  Constitutional:      General: He is not in acute distress.    Appearance: He is well-developed. He is not ill-appearing or diaphoretic.  HENT:     Head: Normocephalic and atraumatic.  Eyes:     Extraocular Movements: Extraocular movements intact.  Cardiovascular:     Rate and Rhythm: Normal rate and regular rhythm.     Heart sounds: Normal heart sounds.  Pulmonary:     Effort: Pulmonary effort is normal.     Breath sounds: Rales (mild, bases) present.  Abdominal:     Palpations: Abdomen is soft.     Tenderness: There is no abdominal tenderness.  Musculoskeletal:      Cervical back: Normal range of motion.  Skin:    General: Skin is warm and dry.  Neurological:     Mental Status: He is alert.     Comments: Patient is alert and oriented to person and place but disoriented to the month.  He knows the year although he was off on this in triage.  He knows who the president is, again answered this incorrectly at first.  Was corrected by his wife on different times of onset from last night and this morning.   CN 3-12 grossly intact. 5/5 strength in all 4 extremities. Grossly normal sensation. Normal finger to nose.      ED Results / Procedures / Treatments   Labs (all labs ordered are listed, but only abnormal results are displayed) Labs Reviewed  COMPREHENSIVE METABOLIC PANEL - Abnormal; Notable for the following components:      Result Value   Potassium 3.0 (*)    Glucose, Bld 213 (*)    All other components within normal limits  URINALYSIS, W/ REFLEX TO CULTURE (INFECTION SUSPECTED) - Abnormal; Notable for the following components:   Glucose, UA >=500 (*)    Bacteria, UA RARE (*)    All other components within normal limits  CBC WITH DIFFERENTIAL/PLATELET - Abnormal; Notable for the following components:   WBC 11.7 (*)    RBC 3.70 (*)    HCT 37.5 (*)    MCV 101.4 (*)    MCH 35.4 (*)    Neutro Abs 10.5 (*)    Lymphs Abs 0.3 (*)    Abs Immature Granulocytes 0.12 (*)    All other components within normal limits  CBG MONITORING, ED - Abnormal; Notable for the following components:   Glucose-Capillary 197 (*)    All other components within normal limits  I-STAT CHEM 8, ED - Abnormal; Notable for the following components:   Potassium 3.2 (*)    Glucose, Bld 214 (*)    All other components within normal limits  CBG MONITORING, ED - Abnormal; Notable for the following components:   Glucose-Capillary 167 (*)    All other components within normal limits  RESP PANEL BY RT-PCR (RSV, FLU A&B, COVID)  RVPGX2  CULTURE, BLOOD (ROUTINE X 2)  CULTURE,  BLOOD (ROUTINE X 2)  RESPIRATORY PANEL BY PCR  RAPID URINE DRUG SCREEN, HOSP PERFORMED  PROCALCITONIN  HEMOGLOBIN A1C  TROPONIN I (HIGH SENSITIVITY)    EKG EKG Interpretation Date/Time:  Thursday April 11 2023 10:11:31 EST Ventricular Rate:  91 PR Interval:  155 QRS Duration:  108 QT Interval:  340 QTC Calculation: 419 R Axis:   69  Text Interpretation: Sinus rhythm  Borderline repolarization abnormality overall similar to Jan 2019 Confirmed by Freddi Hamilton [phone]) on 04/11/2023 10:19:40 AM  Radiology CT Chest W Contrast Result Date: 04/11/2023 CLINICAL DATA:  Respiratory illness, nondiagnostic xray.  Cough. EXAM: CT CHEST WITH CONTRAST TECHNIQUE: Multidetector CT imaging of the chest was performed during intravenous contrast administration. RADIATION DOSE REDUCTION: This exam was performed according to the departmental dose-optimization program which includes automated exposure control, adjustment of the mA and/or kV according to patient size and/or use of iterative reconstruction technique. CONTRAST:  80mL OMNIPAQUE  IOHEXOL  300 MG/ML  SOLN COMPARISON:  Chest x-ray today FINDINGS: Cardiovascular: Heart is normal size. Aorta is normal caliber. Three vessel coronary artery disease. Scattered aortic calcifications. Mediastinum/Nodes: No mediastinal, hilar, or axillary adenopathy. Trachea and esophagus are unremarkable. Thyroid  unremarkable. Lungs/Pleura: Patchy ground-glass opacities in the right lung and left lower lobe could reflect early pneumonia. No effusions. Upper Abdomen: No acute findings. Musculoskeletal: Mild bilateral gynecomastia. No acute bony abnormality. IMPRESSION: Patchy ground-glass opacities throughout the right lung and in the left lower lobe could reflect early multifocal pneumonia. Three-vessel coronary artery disease. Aortic Atherosclerosis (ICD10-I70.0). Electronically Signed   By: Franky Crease M.D.   On: 04/11/2023 12:25   DG Chest 1 View Result Date:  04/11/2023 CLINICAL DATA:  73 year old male with cough. Left nipple shadow versus lung nodule. EXAM: CHEST  1 VIEW COMPARISON:  Chest radiographs 0930 hours today. FINDINGS: Upright PA view at 0958 hours with nipple markers. Left nipple shadow confirmed. Lung volumes and mediastinal contours stable and within normal limits. Lungs appear stable, negative. No acute osseous abnormality identified. Negative visible bowel gas. IMPRESSION: Confirmed left nipple shadow.  No acute cardiopulmonary abnormality. Electronically Signed   By: VEAR Hurst M.D.   On: 04/11/2023 10:29   CT Head Wo Contrast Result Date: 04/11/2023 CLINICAL DATA:  Altered mental status.  Right-sided weakness. EXAM: CT HEAD WITHOUT CONTRAST TECHNIQUE: Contiguous axial images were obtained from the base of the skull through the vertex without intravenous contrast. RADIATION DOSE REDUCTION: This exam was performed according to the departmental dose-optimization program which includes automated exposure control, adjustment of the mA and/or kV according to patient size and/or use of iterative reconstruction technique. COMPARISON:  None Available. FINDINGS: Brain: No evidence of acute infarction, hemorrhage, hydrocephalus, extra-axial collection or mass lesion/mass effect. Vascular: Atherosclerotic vascular calcification of the carotid siphons. No hyperdense vessel. Skull: Normal. Negative for fracture or focal lesion. Sinuses/Orbits: No acute finding. Other: None. IMPRESSION: 1. No acute intracranial abnormality. Electronically Signed   By: Elsie ONEIDA Shoulder M.D.   On: 04/11/2023 10:13   DG Chest 2 View Result Date: 04/11/2023 CLINICAL DATA:  Disorientation, weakness to right side, cough EXAM: CHEST - 2 VIEW COMPARISON:  03/19/2017 FINDINGS: Stable cardiomediastinal silhouette. Aortic atherosclerotic calcification. Ill-defined nodular opacity projecting over the left lower lung is favored to represent a nipple shadow. No focal consolidation, pleural effusion,  or pneumothorax. No displaced rib fractures. IMPRESSION: 1. No acute cardiopulmonary disease. 2. Ill-defined nodular opacity projecting over the left lower lung is favored to represent a nipple shadow. Consider repeat radiograph with nipple markers for confirmation. Electronically Signed   By: Norman Gatlin M.D.   On: 04/11/2023 09:42    Procedures Procedures    Medications Ordered in ED Medications  azithromycin  (ZITHROMAX ) 500 mg in sodium chloride  0.9 % 250 mL IVPB (500 mg Intravenous New Bag/Given 04/11/23 1239)  cefTRIAXone  (ROCEPHIN ) 2 g in sodium chloride  0.9 % 100 mL IVPB (has no administration in time range)  aspirin  EC  tablet 81 mg (has no administration in time range)  atorvastatin  (LIPITOR) tablet 20 mg (has no administration in time range)  lisinopril -hydrochlorothiazide  (ZESTORETIC ) 20-25 MG per tablet 1 tablet (has no administration in time range)  diltiazem  (TIAZAC ) 24 hr capsule 300 mg (has no administration in time range)  linagliptin  (TRADJENTA ) tablet 5 mg (has no administration in time range)  pantoprazole  (PROTONIX ) EC tablet 40 mg (has no administration in time range)  ferrous sulfate  tablet 325 mg (has no administration in time range)  fluticasone  (FLONASE ) 50 MCG/ACT nasal spray 1 spray (has no administration in time range)  prednisoLONE  acetate (PRED FORTE ) 1 % ophthalmic suspension 1 drop (has no administration in time range)  sodium chloride  (MURO 128) 5 % ophthalmic ointment 1 Application (has no administration in time range)  enoxaparin  (LOVENOX ) injection 40 mg (has no administration in time range)  acetaminophen  (TYLENOL ) tablet 650 mg (has no administration in time range)    Or  acetaminophen  (TYLENOL ) suppository 650 mg (has no administration in time range)  ondansetron  (ZOFRAN ) tablet 4 mg (has no administration in time range)    Or  ondansetron  (ZOFRAN ) injection 4 mg (has no administration in time range)  ipratropium-albuterol  (DUONEB) 0.5-2.5 (3)  MG/3ML nebulizer solution 3 mL (has no administration in time range)  dextromethorphan -guaiFENesin  (MUCINEX  DM) 30-600 MG per 12 hr tablet 1 tablet (has no administration in time range)  insulin  aspart (novoLOG ) injection 0-5 Units (has no administration in time range)  insulin  aspart (novoLOG ) injection 0-9 Units (has no administration in time range)  acetaminophen  (TYLENOL ) tablet 650 mg (650 mg Oral Given 04/11/23 1101)  cefTRIAXone  (ROCEPHIN ) 2 g in sodium chloride  0.9 % 100 mL IVPB (0 g Intravenous Stopped 04/11/23 1239)  potassium chloride  SA (KLOR-CON  M) CR tablet 40 mEq (40 mEq Oral Given 04/11/23 1231)  iohexol  (OMNIPAQUE ) 300 MG/ML solution 80 mL (80 mLs Intravenous Contrast Given 04/11/23 1140)    ED Course/ Medical Decision Making/ A&P                                 Medical Decision Making Amount and/or Complexity of Data Reviewed Labs: ordered.    Details: Mild leukocytosis.  Mild hypokalemia Radiology: ordered and independent interpretation performed.    Details: Chest x-ray clear, CT with bilateral pneumonia.  CT head no head bleed. ECG/medicine tests: ordered and independent interpretation performed.    Details: No ischemia.  Risk OTC drugs. Prescription drug management. Decision regarding hospitalization.   Patient ultimately was found to have a fever as well as pneumonia.  His symptoms suggest pneumonia though would be occult on x-ray.  Given he has a fever and some confusion (which seems to be improving is likely related to the fever itself) I think he will need admission overnight.  I did discuss that at first there was no clear cause though he was felt to be a respiratory source.  We discussed LP, but patient and wife declined.  The patient is not so altered and his wife is also declining that I do not think helping him would be prudent at this time in this light.  He will be admitted and I discussed with the hospitalist, Dr. Maree.  He advises CT to see if there is an  occult pneumonia and the CT does show this so I think this is the cause of all of his symptoms.  He was already started on Rocephin  and azithromycin .  He will be admitted to the hospitalist service.  While it was thought to be lower concern for CNS infection such as meningitis/encephalitis, I think with a positive CT finding I do not think he needs LP anymore.        Final Clinical Impression(s) / ED Diagnoses Final diagnoses:  Community acquired pneumonia, unspecified laterality    Rx / DC Orders ED Discharge Orders     None         Freddi Hamilton, MD 04/11/23 1342

## 2023-04-12 DIAGNOSIS — A419 Sepsis, unspecified organism: Secondary | ICD-10-CM | POA: Diagnosis not present

## 2023-04-12 DIAGNOSIS — J189 Pneumonia, unspecified organism: Secondary | ICD-10-CM | POA: Diagnosis not present

## 2023-04-12 LAB — MAGNESIUM: Magnesium: 1.4 mg/dL — ABNORMAL LOW (ref 1.7–2.4)

## 2023-04-12 LAB — CBC
HCT: 34.5 % — ABNORMAL LOW (ref 39.0–52.0)
Hemoglobin: 11.9 g/dL — ABNORMAL LOW (ref 13.0–17.0)
MCH: 35 pg — ABNORMAL HIGH (ref 26.0–34.0)
MCHC: 34.5 g/dL (ref 30.0–36.0)
MCV: 101.5 fL — ABNORMAL HIGH (ref 80.0–100.0)
Platelets: 203 10*3/uL (ref 150–400)
RBC: 3.4 MIL/uL — ABNORMAL LOW (ref 4.22–5.81)
RDW: 14.5 % (ref 11.5–15.5)
WBC: 8.9 10*3/uL (ref 4.0–10.5)
nRBC: 0 % (ref 0.0–0.2)

## 2023-04-12 LAB — BASIC METABOLIC PANEL
Anion gap: 9 (ref 5–15)
BUN: 18 mg/dL (ref 8–23)
CO2: 22 mmol/L (ref 22–32)
Calcium: 9 mg/dL (ref 8.9–10.3)
Chloride: 102 mmol/L (ref 98–111)
Creatinine, Ser: 0.95 mg/dL (ref 0.61–1.24)
GFR, Estimated: >60 mL/min (ref >60)
Glucose, Bld: 140 mg/dL — ABNORMAL HIGH (ref 70–99)
Potassium: 3.2 mmol/L — ABNORMAL LOW (ref 3.5–5.1)
Sodium: 133 mmol/L — ABNORMAL LOW (ref 135–145)

## 2023-04-12 LAB — GLUCOSE, CAPILLARY: Glucose-Capillary: 135 mg/dL — ABNORMAL HIGH (ref 70–99)

## 2023-04-12 MED ORDER — MAGNESIUM SULFATE 2 GM/50ML IV SOLN
2.0000 g | Freq: Once | INTRAVENOUS | Status: AC
  Administered 2023-04-12: 2 g via INTRAVENOUS
  Filled 2023-04-12: qty 50

## 2023-04-12 MED ORDER — ALBUTEROL SULFATE HFA 108 (90 BASE) MCG/ACT IN AERS: 2.0000 | INHALATION_SPRAY | Freq: Four times a day (QID) | RESPIRATORY_TRACT | 2 refills | Status: DC | PRN

## 2023-04-12 MED ORDER — AZITHROMYCIN 500 MG PO TABS: 500.0000 mg | ORAL_TABLET | Freq: Every day | ORAL | 0 refills | Status: AC

## 2023-04-12 MED ORDER — OSELTAMIVIR PHOSPHATE 75 MG PO CAPS
75.0000 mg | ORAL_CAPSULE | Freq: Two times a day (BID) | ORAL | Status: DC
  Administered 2023-04-12: 75 mg via ORAL
  Filled 2023-04-12: qty 1

## 2023-04-12 MED ORDER — DM-GUAIFENESIN ER 30-600 MG PO TB12: 1.0000 | ORAL_TABLET | Freq: Two times a day (BID) | ORAL | 0 refills | Status: AC

## 2023-04-12 MED ORDER — POTASSIUM CHLORIDE CRYS ER 20 MEQ PO TBCR
40.0000 meq | EXTENDED_RELEASE_TABLET | Freq: Two times a day (BID) | ORAL | Status: DC
  Administered 2023-04-12: 40 meq via ORAL
  Filled 2023-04-12: qty 2

## 2023-04-12 MED ORDER — OSELTAMIVIR PHOSPHATE 75 MG PO CAPS: 75.0000 mg | ORAL_CAPSULE | Freq: Two times a day (BID) | ORAL | 0 refills | Status: AC

## 2023-04-12 MED ORDER — CEFDINIR 300 MG PO CAPS: 300.0000 mg | ORAL_CAPSULE | Freq: Two times a day (BID) | ORAL | 0 refills | Status: AC

## 2023-04-12 NOTE — Care Management Obs Status (Addendum)
 Transition of Care [facility]) - Inpatient Brief Assessment   Patient Details  Name: Reginald Stephens MRN: [id number] Date of Birth: 03-Apr-1950  [facility] CM/SW Contact:    Sharlyne Stabs, RN Phone Number: 04/12/2023, 11:06 AM   Clinical Narrative:  Patient admitted with sepsis due to pneumonia. Wife at the bedside. CM in the room to explain status change and plans to discharge home. Patient has no other needs. RN updated.

## 2023-04-12 NOTE — Discharge Summary (Signed)
 Physician Discharge Summary  Reginald Stephens:[id number] DOB: [birth year] DOA: 04/11/2023  PCP: Marvine Rush, MD  Admit date: 04/11/2023  Discharge date: 04/12/2023  Admitted From:Home  Disposition:  Home  Recommendations for Outpatient Follow-up:  Follow up with PCP in 1-2 weeks Continue on Tamiflu  and antibiotics to finish course of therapy as prescribed Continue other home medications as prior  Home Health: None  Equipment/Devices: None  Discharge Condition:Stable  CODE STATUS: Full  Diet recommendation: Heart Healthy/carb modified  Brief/Interim Summary:   Reginald Stephens is a 73 y.o. male with medical history significant for type 2 diabetes, GERD, dyslipidemia, hypertension, and GERD who presented to the ED with complaints of fever that began last night.  He was also noted to be more weak, fatigued, and confused this morning at which point he was brought to the emergency department.  He was admitted for sepsis, POA, secondary to bilateral multifocal pneumonia and started on IV Rocephin  and azithromycin .  He was later noted to also have influenza A and now has been started on Tamiflu .  His fevers have subsided and he is eager for discharge this morning.  He is no longer requiring oxygen supplementation as well.  He may remain on home oral antibiotics as prescribed.  No other acute events or concerns noted and he has recovered faster than expected.  Discharge Diagnoses:  Principal Problem:   Sepsis due to pneumonia [facility]) Active Problems:   Essential hypertension   Hyperlipidemia   Diabetes (HCC)   Gastroesophageal reflux disease without esophagitis  Principal discharge diagnosis: Sepsis, POA, secondary to bilateral multifocal pneumonia superimposed on influenza A with acute hypoxemic respiratory failure.  Discharge Instructions  Discharge Instructions     Diet - low sodium heart healthy   Complete by: As directed    Increase activity slowly   Complete by: As directed        Allergies as of 04/12/2023       Reactions   Shellfish Allergy Nausea And Vomiting        Medication List     TAKE these medications    albuterol  108 (90 Base) MCG/ACT inhaler Commonly known as: VENTOLIN  HFA Inhale 2 puffs into the lungs every 6 (six) hours as needed for wheezing or shortness of breath.   aspirin  EC 81 MG tablet Take 81 mg by mouth daily.   atorvastatin  20 MG tablet Commonly known as: LIPITOR Take 20 mg by mouth daily.   azithromycin  500 MG tablet Commonly known as: Zithromax  Take 1 tablet (500 mg total) by mouth daily for 4 days.   cefdinir  300 MG capsule Commonly known as: OMNICEF  Take 1 capsule (300 mg total) by mouth 2 (two) times daily for 4 days.   dextromethorphan -guaiFENesin  30-600 MG 12hr tablet Commonly known as: MUCINEX  DM Take 1 tablet by mouth 2 (two) times daily for 7 days.   ferrous sulfate  325 (65 FE) MG tablet Take 325 mg by mouth daily with breakfast.   fluticasone  50 MCG/ACT nasal spray Commonly known as: FLONASE  Place 1 spray into both nostrils daily.   Januvia 100 MG tablet Generic drug: sitaGLIPtin Take 100 mg by mouth daily.   lisinopril -hydrochlorothiazide  20-25 MG tablet Commonly known as: ZESTORETIC  Take 1 tablet by mouth daily.   metFORMIN 1000 MG tablet Commonly known as: GLUCOPHAGE Take 1,000 mg by mouth 2 (two) times daily with a meal.   omeprazole  40 MG capsule Commonly known as: PRILOSEC Take 1 capsule (40 mg total) by mouth 2 (two) times daily.  oseltamivir  75 MG capsule Commonly known as: TAMIFLU  Take 1 capsule (75 mg total) by mouth 2 (two) times daily for 4 days.   prednisoLONE  acetate 1 % ophthalmic suspension Commonly known as: PRED FORTE  Place 1 drop into the left eye 2 (two) times daily.   sodium chloride  5 % ophthalmic ointment Commonly known as: MURO 128 Place 1 Application into the left eye at bedtime as needed.   Tiadylt  ER 300 MG 24 hr capsule Generic drug: diltiazem  Take 300  mg by mouth daily.   valACYclovir 1000 MG tablet Commonly known as: VALTREX Take 1,000 mg by mouth daily.   Vitamin D3 1.25 MG (50000 UT) Caps Take 50,000 Units by mouth once a week. Friday        Follow-up Information     Marvine Rush, MD. Schedule an appointment as soon as possible for a visit in 1 week(s).   Specialty: Family Medicine Contact information: [address] [place] [address] [phone]                Allergies  Allergen Reactions   Shellfish Allergy Nausea And Vomiting    Consultations: None   Procedures/Studies: CT Chest W Contrast Result Date: 04/11/2023 CLINICAL DATA:  Respiratory illness, nondiagnostic xray.  Cough. EXAM: CT CHEST WITH CONTRAST TECHNIQUE: Multidetector CT imaging of the chest was performed during intravenous contrast administration. RADIATION DOSE REDUCTION: This exam was performed according to the departmental dose-optimization program which includes automated exposure control, adjustment of the mA and/or kV according to patient size and/or use of iterative reconstruction technique. CONTRAST:  80mL OMNIPAQUE  IOHEXOL  300 MG/ML  SOLN COMPARISON:  Chest x-ray today FINDINGS: Cardiovascular: Heart is normal size. Aorta is normal caliber. Three vessel coronary artery disease. Scattered aortic calcifications. Mediastinum/Nodes: No mediastinal, hilar, or axillary adenopathy. Trachea and esophagus are unremarkable. Thyroid  unremarkable. Lungs/Pleura: Patchy ground-glass opacities in the right lung and left lower lobe could reflect early pneumonia. No effusions. Upper Abdomen: No acute findings. Musculoskeletal: Mild bilateral gynecomastia. No acute bony abnormality. IMPRESSION: Patchy ground-glass opacities throughout the right lung and in the left lower lobe could reflect early multifocal pneumonia. Three-vessel coronary artery disease. Aortic Atherosclerosis (ICD10-I70.0). Electronically Signed   By: Franky Crease M.D.   On:  04/11/2023 12:25   DG Chest 1 View Result Date: 04/11/2023 CLINICAL DATA:  73 year old male with cough. Left nipple shadow versus lung nodule. EXAM: CHEST  1 VIEW COMPARISON:  Chest radiographs 0930 hours today. FINDINGS: Upright PA view at 0958 hours with nipple markers. Left nipple shadow confirmed. Lung volumes and mediastinal contours stable and within normal limits. Lungs appear stable, negative. No acute osseous abnormality identified. Negative visible bowel gas. IMPRESSION: Confirmed left nipple shadow.  No acute cardiopulmonary abnormality. Electronically Signed   By: VEAR Hurst M.D.   On: 04/11/2023 10:29   CT Head Wo Contrast Result Date: 04/11/2023 CLINICAL DATA:  Altered mental status.  Right-sided weakness. EXAM: CT HEAD WITHOUT CONTRAST TECHNIQUE: Contiguous axial images were obtained from the base of the skull through the vertex without intravenous contrast. RADIATION DOSE REDUCTION: This exam was performed according to the departmental dose-optimization program which includes automated exposure control, adjustment of the mA and/or kV according to patient size and/or use of iterative reconstruction technique. COMPARISON:  None Available. FINDINGS: Brain: No evidence of acute infarction, hemorrhage, hydrocephalus, extra-axial collection or mass lesion/mass effect. Vascular: Atherosclerotic vascular calcification of the carotid siphons. No hyperdense vessel. Skull: Normal. Negative for fracture or focal lesion. Sinuses/Orbits: No acute finding. Other:  None. IMPRESSION: 1. No acute intracranial abnormality. Electronically Signed   By: Elsie ONEIDA Shoulder M.D.   On: 04/11/2023 10:13   DG Chest 2 View Result Date: 04/11/2023 CLINICAL DATA:  Disorientation, weakness to right side, cough EXAM: CHEST - 2 VIEW COMPARISON:  03/19/2017 FINDINGS: Stable cardiomediastinal silhouette. Aortic atherosclerotic calcification. Ill-defined nodular opacity projecting over the left lower lung is favored to represent a  nipple shadow. No focal consolidation, pleural effusion, or pneumothorax. No displaced rib fractures. IMPRESSION: 1. No acute cardiopulmonary disease. 2. Ill-defined nodular opacity projecting over the left lower lung is favored to represent a nipple shadow. Consider repeat radiograph with nipple markers for confirmation. Electronically Signed   By: Norman Gatlin M.D.   On: 04/11/2023 09:42     Discharge Exam: Vitals:   04/12/23 0500 04/12/23 0917  BP:  (!) 151/76  Pulse: 90 79  Resp: 18   Temp: 99.3 F (37.4 C) 98 F (36.7 C)  SpO2:  98%   Vitals:   04/12/23 0006 04/12/23 0400 04/12/23 0500 04/12/23 0917  BP: 119/71 (!) 140/72  (!) 151/76  Pulse: 88  90 79  Resp: (!) 22 (!) 24 18   Temp: 98.3 F (36.8 C)  99.3 F (37.4 C) 98 F (36.7 C)  TempSrc: Oral  Oral Oral  SpO2: 97%   98%  Weight:      Height:        General: Pt is alert, awake, not in acute distress Cardiovascular: RRR, S1/S2 +, no rubs, no gallops Respiratory: CTA bilaterally, no wheezing, no rhonchi Abdominal: Soft, NT, ND, bowel sounds + Extremities: no edema, no cyanosis    The results of significant diagnostics from this hospitalization (including imaging, microbiology, ancillary and laboratory) are listed below for reference.     Microbiology: Recent Results (from the past 240 hours)  Resp panel by RT-PCR (RSV, Flu A&B, Covid) Anterior Nasal Swab     Status: None   Collection Time: 04/11/23  9:20 AM   Specimen: Anterior Nasal Swab  Result Value Ref Range Status   SARS Coronavirus 2 by RT PCR NEGATIVE NEGATIVE Final    Comment: (NOTE) SARS-CoV-2 target nucleic acids are NOT DETECTED.  The SARS-CoV-2 RNA is generally detectable in upper respiratory specimens during the acute phase of infection. The lowest concentration of SARS-CoV-2 viral copies this assay can detect is 138 copies/mL. A negative result does not preclude SARS-Cov-2 infection and should not be used as the sole basis for treatment  or other patient management decisions. A negative result may occur with  improper specimen collection/handling, submission of specimen other than nasopharyngeal swab, presence of viral mutation(s) within the areas targeted by this assay, and inadequate number of viral copies(<138 copies/mL). A negative result must be combined with clinical observations, patient history, and epidemiological information. The expected result is Negative.  Fact Sheet for Patients:  bloggercourse.com  Fact Sheet for Healthcare Providers:  seriousbroker.it  This test is no t yet approved or cleared by the [place]  FDA and  has been authorized for detection and/or diagnosis of SARS-CoV-2 by FDA under an Emergency Use Authorization (EUA). This EUA will remain  in effect (meaning this test can be used) for the duration of the COVID-19 declaration under Section 564(b)(1) of the Act, 21 U.S.C.section 360bbb-3(b)(1), unless the authorization is terminated  or revoked sooner.       Influenza A by PCR NEGATIVE NEGATIVE Final   Influenza B by PCR NEGATIVE NEGATIVE Final    Comment: (NOTE) The  Xpert Xpress SARS-CoV-2/FLU/RSV plus assay is intended as an aid in the diagnosis of influenza from Nasopharyngeal swab specimens and should not be used as a sole basis for treatment. Nasal washings and aspirates are unacceptable for Xpert Xpress SARS-CoV-2/FLU/RSV testing.  Fact Sheet for Patients: bloggercourse.com  Fact Sheet for Healthcare Providers: seriousbroker.it  This test is not yet approved or cleared by the [place]  FDA and has been authorized for detection and/or diagnosis of SARS-CoV-2 by FDA under an Emergency Use Authorization (EUA). This EUA will remain in effect (meaning this test can be used) for the duration of the COVID-19 declaration under Section 564(b)(1) of the Act, 21 U.S.C. section  360bbb-3(b)(1), unless the authorization is terminated or revoked.     Resp Syncytial Virus by PCR NEGATIVE NEGATIVE Final    Comment: (NOTE) Fact Sheet for Patients: bloggercourse.com  Fact Sheet for Healthcare Providers: seriousbroker.it  This test is not yet approved or cleared by the [place]  FDA and has been authorized for detection and/or diagnosis of SARS-CoV-2 by FDA under an Emergency Use Authorization (EUA). This EUA will remain in effect (meaning this test can be used) for the duration of the COVID-19 declaration under Section 564(b)(1) of the Act, 21 U.S.C. section 360bbb-3(b)(1), unless the authorization is terminated or revoked.  Performed at [facility], [address] [place], [place] [address]   Culture, blood (routine x 2)     Status: None (Preliminary result)   Collection Time: 04/11/23 10:22 AM   Specimen: BLOOD  Result Value Ref Range Status   Specimen Description BLOOD BLOOD RIGHT ARM FOREARM  Final   Special Requests   Final    BOTTLES DRAWN AEROBIC AND ANAEROBIC Blood Culture adequate volume   Culture   Final    NO GROWTH < 24 HOURS Performed at [facility], [address], [place], [place] [address]    Report Status PENDING  Incomplete  Culture, blood (routine x 2)     Status: None (Preliminary result)   Collection Time: 04/11/23 10:22 AM   Specimen: BLOOD  Result Value Ref Range Status   Specimen Description BLOOD BLOOD RIGHT HAND  Final   Special Requests   Final    Blood Culture adequate volume BOTTLES DRAWN AEROBIC AND ANAEROBIC   Culture   Final    NO GROWTH < 24 HOURS Performed at [facility], [address], [place], [place] [address]    Report Status PENDING  Incomplete  Respiratory (~20 pathogens) panel by PCR     Status: Abnormal   Collection Time: 04/11/23 11:53 AM   Specimen: Nasopharyngeal Swab; Respiratory  Result Value Ref Range Status   Adenovirus NOT DETECTED NOT DETECTED  Final   Coronavirus 229E NOT DETECTED NOT DETECTED Final    Comment: (NOTE) The Coronavirus on the Respiratory Panel, DOES NOT test for the novel  Coronavirus (2019 nCoV)    Coronavirus HKU1 NOT DETECTED NOT DETECTED Final   Coronavirus NL63 NOT DETECTED NOT DETECTED Final   Coronavirus OC43 NOT DETECTED NOT DETECTED Final   Metapneumovirus NOT DETECTED NOT DETECTED Final   Rhinovirus / Enterovirus NOT DETECTED NOT DETECTED Final   Influenza A H1 2009 DETECTED (A) NOT DETECTED Final   Influenza B NOT DETECTED NOT DETECTED Final   Parainfluenza Virus 1 NOT DETECTED NOT DETECTED Final   Parainfluenza Virus 2 NOT DETECTED NOT DETECTED Final   Parainfluenza Virus 3 NOT DETECTED NOT DETECTED Final   Parainfluenza Virus 4 NOT DETECTED NOT DETECTED Final   Respiratory  Syncytial Virus NOT DETECTED NOT DETECTED Final   Bordetella pertussis NOT DETECTED NOT DETECTED Final   Bordetella Parapertussis NOT DETECTED NOT DETECTED Final   Chlamydophila pneumoniae NOT DETECTED NOT DETECTED Final   Mycoplasma pneumoniae NOT DETECTED NOT DETECTED Final    Comment: Performed at [facility], [address], [place], [place] [address]     Labs: BNP (last 3 results) No results for input(s): BNP in the last 8760 hours. Basic Metabolic Panel: Recent Labs  Lab 04/11/23 0920 04/11/23 0929 04/12/23 0550  NA 137 135 133*  K 3.2* 3.0* 3.2*  CL 101 101 102  CO2  --  23 22  GLUCOSE 214* 213* 140*  BUN 15 15 18   CREATININE 0.80 0.87 0.95  CALCIUM   --  9.7 9.0  MG  --   --  1.4*   Liver Function Tests: Recent Labs  Lab 04/11/23 0929  AST 19  ALT 19  ALKPHOS 57  BILITOT 0.9  PROT 6.9  ALBUMIN 4.0   No results for input(s): LIPASE, AMYLASE in the last 168 hours. No results for input(s): AMMONIA in the last 168 hours. CBC: Recent Labs  Lab 04/11/23 0920 04/11/23 0929 04/12/23 0550  WBC  --  11.7* 8.9  NEUTROABS  --  10.5*  --   HGB 15.0 13.1 11.9*  HCT 44.0 37.5* 34.5*   MCV  --  101.4* 101.5*  PLT  --  251 203   Cardiac Enzymes: No results for input(s): CKTOTAL, CKMB, CKMBINDEX, TROPONINI in the last 168 hours. BNP: Invalid input(s): POCBNP CBG: Recent Labs  Lab 04/11/23 0909 04/11/23 1131 04/11/23 1722 04/11/23 2119 04/12/23 0728  GLUCAP 197* 167* 171* 128* 135*   D-Dimer No results for input(s): DDIMER in the last 72 hours. Hgb A1c Recent Labs    04/11/23 0929  HGBA1C 6.4*   Lipid Profile No results for input(s): CHOL, HDL, LDLCALC, TRIG, CHOLHDL, LDLDIRECT in the last 72 hours. Thyroid  function studies No results for input(s): TSH, T4TOTAL, T3FREE, THYROIDAB in the last 72 hours.  Invalid input(s): FREET3 Anemia work up No results for input(s): VITAMINB12, FOLATE, FERRITIN, TIBC, IRON, RETICCTPCT in the last 72 hours. Urinalysis    Component Value Date/Time   COLORURINE YELLOW 04/11/2023 1030   APPEARANCEUR CLEAR 04/11/2023 1030   APPEARANCEUR Clear 01/22/2023 1322   LABSPEC 1.021 04/11/2023 1030   PHURINE 5.0 04/11/2023 1030   GLUCOSEU >=500 (A) 04/11/2023 1030   HGBUR NEGATIVE 04/11/2023 1030   BILIRUBINUR NEGATIVE 04/11/2023 1030   BILIRUBINUR Negative 01/22/2023 1322   KETONESUR NEGATIVE 04/11/2023 1030   PROTEINUR NEGATIVE 04/11/2023 1030   UROBILINOGEN 0.2 05/10/2013 1130   NITRITE NEGATIVE 04/11/2023 1030   LEUKOCYTESUR NEGATIVE 04/11/2023 1030   Sepsis Labs Recent Labs  Lab 04/11/23 0929 04/12/23 0550  WBC 11.7* 8.9   Microbiology Recent Results (from the past 240 hours)  Resp panel by RT-PCR (RSV, Flu A&B, Covid) Anterior Nasal Swab     Status: None   Collection Time: 04/11/23  9:20 AM   Specimen: Anterior Nasal Swab  Result Value Ref Range Status   SARS Coronavirus 2 by RT PCR NEGATIVE NEGATIVE Final    Comment: (NOTE) SARS-CoV-2 target nucleic acids are NOT DETECTED.  The SARS-CoV-2 RNA is generally detectable in upper respiratory specimens during the  acute phase of infection. The lowest concentration of SARS-CoV-2 viral copies this assay can detect is 138 copies/mL. A negative result does not preclude SARS-Cov-2 infection and should not be used as the  sole basis for treatment or other patient management decisions. A negative result may occur with  improper specimen collection/handling, submission of specimen other than nasopharyngeal swab, presence of viral mutation(s) within the areas targeted by this assay, and inadequate number of viral copies(<138 copies/mL). A negative result must be combined with clinical observations, patient history, and epidemiological information. The expected result is Negative.  Fact Sheet for Patients:  bloggercourse.com  Fact Sheet for Healthcare Providers:  seriousbroker.it  This test is no t yet approved or cleared by the [place]  FDA and  has been authorized for detection and/or diagnosis of SARS-CoV-2 by FDA under an Emergency Use Authorization (EUA). This EUA will remain  in effect (meaning this test can be used) for the duration of the COVID-19 declaration under Section 564(b)(1) of the Act, 21 U.S.C.section 360bbb-3(b)(1), unless the authorization is terminated  or revoked sooner.       Influenza A by PCR NEGATIVE NEGATIVE Final   Influenza B by PCR NEGATIVE NEGATIVE Final    Comment: (NOTE) The Xpert Xpress SARS-CoV-2/FLU/RSV plus assay is intended as an aid in the diagnosis of influenza from Nasopharyngeal swab specimens and should not be used as a sole basis for treatment. Nasal washings and aspirates are unacceptable for Xpert Xpress SARS-CoV-2/FLU/RSV testing.  Fact Sheet for Patients: bloggercourse.com  Fact Sheet for Healthcare Providers: seriousbroker.it  This test is not yet approved or cleared by the [place]  FDA and has been authorized for detection and/or  diagnosis of SARS-CoV-2 by FDA under an Emergency Use Authorization (EUA). This EUA will remain in effect (meaning this test can be used) for the duration of the COVID-19 declaration under Section 564(b)(1) of the Act, 21 U.S.C. section 360bbb-3(b)(1), unless the authorization is terminated or revoked.     Resp Syncytial Virus by PCR NEGATIVE NEGATIVE Final    Comment: (NOTE) Fact Sheet for Patients: bloggercourse.com  Fact Sheet for Healthcare Providers: seriousbroker.it  This test is not yet approved or cleared by the [place]  FDA and has been authorized for detection and/or diagnosis of SARS-CoV-2 by FDA under an Emergency Use Authorization (EUA). This EUA will remain in effect (meaning this test can be used) for the duration of the COVID-19 declaration under Section 564(b)(1) of the Act, 21 U.S.C. section 360bbb-3(b)(1), unless the authorization is terminated or revoked.  Performed at [facility], [address] [place], [place] [address]   Culture, blood (routine x 2)     Status: None (Preliminary result)   Collection Time: 04/11/23 10:22 AM   Specimen: BLOOD  Result Value Ref Range Status   Specimen Description BLOOD BLOOD RIGHT ARM FOREARM  Final   Special Requests   Final    BOTTLES DRAWN AEROBIC AND ANAEROBIC Blood Culture adequate volume   Culture   Final    NO GROWTH < 24 HOURS Performed at [facility], [address], [place], [place] [address]    Report Status PENDING  Incomplete  Culture, blood (routine x 2)     Status: None (Preliminary result)   Collection Time: 04/11/23 10:22 AM   Specimen: BLOOD  Result Value Ref Range Status   Specimen Description BLOOD BLOOD RIGHT HAND  Final   Special Requests   Final    Blood Culture adequate volume BOTTLES DRAWN AEROBIC AND ANAEROBIC   Culture   Final    NO GROWTH < 24 HOURS Performed at [facility], [address], [place], [place] [address]    Report  Status PENDING  Incomplete  Respiratory (~20 pathogens) panel by PCR     Status: Abnormal   Collection Time: 04/11/23 11:53 AM   Specimen: Nasopharyngeal Swab; Respiratory  Result Value Ref Range Status   Adenovirus NOT DETECTED NOT DETECTED Final   Coronavirus 229E NOT DETECTED NOT DETECTED Final    Comment: (NOTE) The Coronavirus on the Respiratory Panel, DOES NOT test for the novel  Coronavirus (2019 nCoV)    Coronavirus HKU1 NOT DETECTED NOT DETECTED Final   Coronavirus NL63 NOT DETECTED NOT DETECTED Final   Coronavirus OC43 NOT DETECTED NOT DETECTED Final   Metapneumovirus NOT DETECTED NOT DETECTED Final   Rhinovirus / Enterovirus NOT DETECTED NOT DETECTED Final   Influenza A H1 2009 DETECTED (A) NOT DETECTED Final   Influenza B NOT DETECTED NOT DETECTED Final   Parainfluenza Virus 1 NOT DETECTED NOT DETECTED Final   Parainfluenza Virus 2 NOT DETECTED NOT DETECTED Final   Parainfluenza Virus 3 NOT DETECTED NOT DETECTED Final   Parainfluenza Virus 4 NOT DETECTED NOT DETECTED Final   Respiratory Syncytial Virus NOT DETECTED NOT DETECTED Final   Bordetella pertussis NOT DETECTED NOT DETECTED Final   Bordetella Parapertussis NOT DETECTED NOT DETECTED Final   Chlamydophila pneumoniae NOT DETECTED NOT DETECTED Final   Mycoplasma pneumoniae NOT DETECTED NOT DETECTED Final    Comment: Performed at [facility], [address] [place], [place] [address]     Time coordinating discharge: 35 minutes  SIGNED:   Adron JONETTA Fairly, DO [facility] 04/12/2023, 10:21 AM  If 7PM-7AM, please contact night-coverage [email or web address]

## 2023-04-12 NOTE — Progress Notes (Signed)
 Pt alert and oriented to self. He does answer yes or no questions with this RN but does not carry on a conversation. Generalized weakness present.  Condom catheter placed overnight due to incontinence. Pt's wife remains at bedside. Per wife, at baseline, he is able to drive a car, goes to work at [employer], and performs ADL's with out  complication. No acute events overnight. Kellogg RN

## 2023-04-12 NOTE — Plan of Care (Signed)
   Problem: Clinical Measurements: Goal: Diagnostic test results will improve Outcome: Progressing

## 2023-04-12 NOTE — Care Management Obs Status (Signed)
 MEDICARE OBSERVATION STATUS NOTIFICATION   Patient Details  Name: Reginald Stephens MRN: [id number] Date of Birth: 18-Jun-1950   Medicare Observation Status Notification Given:  Yes    Orelia Binet, RN 04/12/2023, 11:00 AM

## 2023-04-12 NOTE — Care Management Obs Status (Signed)
 MEDICARE OBSERVATION STATUS NOTIFICATION   Patient Details  Name: OAK DOREY MRN: [id number] Date of Birth: Oct 15, 1950   Medicare Observation Status Notification Given:  Yes    Orelia Binet, RN 04/12/2023, 11:01 AM

## 2023-04-12 NOTE — Care Management CC44 (Signed)
 Condition Code 44 Documentation Completed  Patient Details  Name: MASSEY RUHLAND MRN: [id number] Date of Birth: 03-23-1950   Condition Code 44 given:  Yes Patient signature on Condition Code 44 notice:  Yes Documentation of 2 MD's agreement:  Yes Code 44 added to claim:  Yes    Sharlyne Stabs, RN 04/12/2023, 11:01 AM

## 2023-04-15 ENCOUNTER — Ambulatory Visit (INDEPENDENT_AMBULATORY_CARE_PROVIDER_SITE_OTHER): Payer: Medicare HMO | Admitting: Gastroenterology

## 2023-04-16 LAB — CULTURE, BLOOD (ROUTINE X 2)
Culture: NO GROWTH
Culture: NO GROWTH
Special Requests: ADEQUATE
Special Requests: ADEQUATE

## 2023-04-19 ENCOUNTER — Other Ambulatory Visit (HOSPITAL_COMMUNITY): Payer: Self-pay | Admitting: Family Medicine

## 2023-04-19 DIAGNOSIS — J189 Pneumonia, unspecified organism: Secondary | ICD-10-CM

## 2023-04-22 ENCOUNTER — Other Ambulatory Visit (INDEPENDENT_AMBULATORY_CARE_PROVIDER_SITE_OTHER): Payer: Self-pay | Admitting: Gastroenterology

## 2023-04-22 DIAGNOSIS — K219 Gastro-esophageal reflux disease without esophagitis: Secondary | ICD-10-CM

## 2023-04-22 NOTE — Telephone Encounter (Signed)
Last seen 04/12/2022. Will need an office visit for further refills.

## 2023-05-17 ENCOUNTER — Other Ambulatory Visit (INDEPENDENT_AMBULATORY_CARE_PROVIDER_SITE_OTHER): Payer: Self-pay | Admitting: Gastroenterology

## 2023-05-17 DIAGNOSIS — K219 Gastro-esophageal reflux disease without esophagitis: Secondary | ICD-10-CM

## 2023-05-20 NOTE — Telephone Encounter (Signed)
 Needs office visit.

## 2023-05-21 ENCOUNTER — Other Ambulatory Visit (INDEPENDENT_AMBULATORY_CARE_PROVIDER_SITE_OTHER): Payer: Self-pay | Admitting: Gastroenterology

## 2023-05-21 DIAGNOSIS — K219 Gastro-esophageal reflux disease without esophagitis: Secondary | ICD-10-CM

## 2023-05-21 NOTE — Telephone Encounter (Signed)
 Last seen in 04/2022. Needs office visit.

## 2023-07-21 ENCOUNTER — Other Ambulatory Visit (INDEPENDENT_AMBULATORY_CARE_PROVIDER_SITE_OTHER): Payer: Self-pay | Admitting: Gastroenterology

## 2023-07-21 DIAGNOSIS — K219 Gastro-esophageal reflux disease without esophagitis: Secondary | ICD-10-CM

## 2023-07-22 NOTE — Telephone Encounter (Signed)
 Patient needs office visit. Last seen 04/12/2022.

## 2023-07-23 ENCOUNTER — Telehealth (INDEPENDENT_AMBULATORY_CARE_PROVIDER_SITE_OTHER): Payer: Self-pay

## 2023-07-23 ENCOUNTER — Other Ambulatory Visit (INDEPENDENT_AMBULATORY_CARE_PROVIDER_SITE_OTHER): Payer: Self-pay

## 2023-07-23 DIAGNOSIS — K219 Gastro-esophageal reflux disease without esophagitis: Secondary | ICD-10-CM

## 2023-07-23 MED ORDER — OMEPRAZOLE 40 MG PO CPDR: 40.0000 mg | DELAYED_RELEASE_CAPSULE | Freq: Two times a day (BID) | ORAL | 0 refills | Status: DC

## 2023-07-23 NOTE — Telephone Encounter (Signed)
 Patient called and says he needs Omeprazole  40 mg bid sent to the pharmacy. I made him aware he had not been seen since 04/12/2022, and we could not fill the script with out being seen. Patient was told that he will need to make an appointment, I sent a 30 day supply to the pharmacy and he was transferred to Amalia Badder to make an appointment.

## 2023-08-18 ENCOUNTER — Other Ambulatory Visit (INDEPENDENT_AMBULATORY_CARE_PROVIDER_SITE_OTHER): Payer: Self-pay | Admitting: Gastroenterology

## 2023-08-18 DIAGNOSIS — K219 Gastro-esophageal reflux disease without esophagitis: Secondary | ICD-10-CM

## 2023-09-10 ENCOUNTER — Other Ambulatory Visit (INDEPENDENT_AMBULATORY_CARE_PROVIDER_SITE_OTHER): Payer: Self-pay | Admitting: Gastroenterology

## 2023-09-10 DIAGNOSIS — K219 Gastro-esophageal reflux disease without esophagitis: Secondary | ICD-10-CM

## 2023-09-10 NOTE — Telephone Encounter (Signed)
 Patient must keep 09/26/2023 office visit for further refills.

## 2023-09-26 ENCOUNTER — Ambulatory Visit (INDEPENDENT_AMBULATORY_CARE_PROVIDER_SITE_OTHER): Admitting: Gastroenterology

## 2023-09-26 ENCOUNTER — Encounter (INDEPENDENT_AMBULATORY_CARE_PROVIDER_SITE_OTHER): Payer: Self-pay | Admitting: Gastroenterology

## 2023-09-26 VITALS — BP 133/66 | HR 66 | Temp 97.9°F | Ht 70.0 in | Wt 227.4 lb

## 2023-09-26 DIAGNOSIS — R09A2 Foreign body sensation, throat: Secondary | ICD-10-CM

## 2023-09-26 DIAGNOSIS — K219 Gastro-esophageal reflux disease without esophagitis: Secondary | ICD-10-CM

## 2023-09-26 NOTE — Progress Notes (Signed)
 Referring Provider: Marvine Rush, MD Primary Care Physician:  Marvine Rush, MD Primary GI Physician: Dr. Eartha   Chief Complaint  Patient presents with   Follow-up    Pt arrives for follow up and refills. No questions/concerns at this time.    HPI:   Reginald Stephens is a 73 y.o. male with past medical history of GERD, diabetes, hyperlipidemia, hypertension, history of kidney stones, asthma   Patient presenting today for:  Follow up of GERD  Last seen February 2024, at that time, seen by ENT a year ago, had laryngoscopy which was normal, recommended to increase omeprazole  40mg  to BID dosing, felt better in terms of globus sensation though did decrease back to once a day after  while.   Recommended to schedule EGD, increase omeprazole  40mg  to BID  EGD was cancelled  Present:  States GERD better on twice daily dosing of omeprazole . He  notes some bubbles in his throat and some belching maybe a few nights per week but does not take anything for this. He notes he continues to have globus sensation, sometimes present upon waking in the mornings but not everyday. Denies any nausea or vomiting. He has lost some weight, down from 242 lbs in November to 227 lbs today. Denies any changes in his appetite or activity. Denies rectal bleeding or melena. States he wants to get EGD rescheduled but mother recently diagnosed with pancreatic cancer and has only about 2-3 months to live. He is taking omeprazole  in the morning and then again around 8-9 pm after his last meal of the day.   Last Colonoscopy:: 09/23/2019 - diverticulosis  Last EGD: 2016-Non Critical schatzki's ring, Hiatal Hernia, Hiatus at 43cm, GE Junction at 40cm  [employer]   09/26/23 0811  Weight: 227 lb 6.4 oz (103.1 kg)     Past Medical History:  Diagnosis Date   Arthritis    Asthma    Chronic kidney disease    Diabetes mellitus    GERD (gastroesophageal reflux disease)    History of kidney stones     Hyperlipidemia    Hypertension    Palpitations     Past Surgical History:  Procedure Laterality Date   COLONOSCOPY  02/15/2011   Procedure: COLONOSCOPY;  Surgeon: Claudis RAYMOND Rivet, MD;  [facility];  Service: Endoscopy;  Laterality: N/A;   COLONOSCOPY N/A 09/23/2019   Procedure: COLONOSCOPY;  Surgeon: Rivet Claudis RAYMOND, MD;  [facility];  Service: Endoscopy;  Laterality: N/A;  730   CYSTOSCOPY  2011   ESOPHAGEAL DILATION N/A 07/08/2014   Procedure: ESOPHAGEAL DILATION;  Surgeon: Claudis RAYMOND Rivet, MD;  [facility];  Service: Endoscopy;  Laterality: N/A;   ESOPHAGOGASTRODUODENOSCOPY N/A 07/08/2014   Procedure: ESOPHAGOGASTRODUODENOSCOPY (EGD);  Surgeon: Claudis RAYMOND Rivet, MD;  [facility];  Service: Endoscopy;  Laterality: N/A;  245   ORIF ANKLE FRACTURE Left 03/19/2017   Procedure: OPEN REDUCTION INTERNAL FIXATION (ORIF) LEFT ANKLE FRACTURE;  Surgeon: Margrette Taft BRAVO, MD;  Location: [facility];  Service: Orthopedics;  Laterality: Left;   TONSILLECTOMY     age 89   TONSILLECTOMY      Current Outpatient Medications  Medication Sig Dispense Refill   aspirin  EC 81 MG tablet Take 81 mg by mouth daily.     atorvastatin  (LIPITOR) 20 MG tablet Take 20 mg by mouth daily.     Cholecalciferol (VITAMIN D3) 50000 units CAPS Take 50,000 Units by mouth once a week. Friday  11  ferrous sulfate  325 (65 FE) MG tablet Take 325 mg by mouth daily with breakfast.     fluticasone  (FLONASE ) 50 MCG/ACT nasal spray Place 1 spray into both nostrils daily.   1   JANUVIA 100 MG tablet Take 100 mg by mouth daily.     lisinopril -hydrochlorothiazide  (PRINZIDE ,ZESTORETIC ) 20-25 MG per tablet Take 1 tablet by mouth daily.      metFORMIN (GLUCOPHAGE) 1000 MG tablet Take 1,000 mg by mouth 2 (two) times daily with a meal.     omeprazole  (PRILOSEC) 40 MG capsule TAKE 1 CAPSULE BY MOUTH TWICE A DAY 60 capsule 0   prednisoLONE  acetate (PRED FORTE ) 1 % ophthalmic suspension Place 1 drop  into the left eye 2 (two) times daily.     sodium chloride  (MURO 128) 5 % ophthalmic ointment Place 1 Application into the left eye at bedtime as needed.     TIADYLT  ER 300 MG 24 hr capsule Take 300 mg by mouth daily.     valACYclovir (VALTREX) 1000 MG tablet Take 1,000 mg by mouth daily.     No current facility-administered medications for this visit.    Allergies as of 09/26/2023 - Review Complete 09/26/2023  Allergen Reaction Noted   Shellfish allergy Nausea And Vomiting 02/13/2011    Social History   Socioeconomic History   Marital status: Married    Spouse name: Not on file   Number of children: Not on file   Years of education: Not on file   Highest education level: Not on file  Occupational History   Not on file  Tobacco Use   Smoking status: Former    Current packs/day: 0.00    Average packs/day: 1 pack/day for 25.0 years (25.0 ttl pk-yrs)    Types: Cigarettes    Start date: 03/19/1992    Quit date: 03/19/2017    Years since quitting: 6.5   Smokeless tobacco: Never  Vaping Use   Vaping status: Never Used  Substance and Sexual Activity   Alcohol  use: Yes    Alcohol /week: 7.0 standard drinks of alcohol     Types: 7 Cans of beer per week    Comment: 1 beer daily   Drug use: No   Sexual activity: Yes    Birth control/protection: None  Other Topics Concern   Not on file  Social History Narrative   Not on file   Social Drivers of Health   Financial Resource Strain: Not on file  Food Insecurity: No Food Insecurity (04/11/2023)   Hunger Vital Sign    Worried About Running Out of Food in the Last Year: Never true    Ran Out of Food in the Last Year: Never true  Transportation Needs: No Transportation Needs (04/11/2023)   PRAPARE - Administrator, Civil Service (Medical): No    Lack of Transportation (Non-Medical): No  Physical Activity: Not on file  Stress: Not on file  Social Connections: Moderately Isolated (04/11/2023)   Social Connection and Isolation  Panel    Frequency of Communication with Friends and Family: More than three times a week    Frequency of Social Gatherings with Friends and Family: Once a week    Attends Religious Services: Never    Database administrator or Organizations: No    Attends Engineer, structural: Never    Marital Status: Married    Review of systems General: negative for malaise, night sweats, fever, chills, weight loss Neck: Negative for lumps, goiter, pain and significant neck swelling  Resp: Negative for cough, wheezing, dyspnea at rest CV: Negative for chest pain, leg swelling, palpitations, orthopnea GI: denies melena, hematochezia, nausea, vomiting, diarrhea, constipation, dysphagia, odyonophagia, early satiety or unintentional weight loss. +globus sensation +GERD symptoms  The remainder of the review of systems is noncontributory.  Physical Exam: BP 133/66   Pulse 66   Temp 97.9 F (36.6 C)   Ht 5' 10 (1.778 m)   Wt 227 lb 6.4 oz (103.1 kg)   BMI 32.63 kg/m  General:   Alert and oriented. No distress noted. Pleasant and cooperative.  Head:  Normocephalic and atraumatic. Eyes:  Conjuctiva clear without scleral icterus. Mouth:  Oral mucosa pink and moist. Good dentition. No lesions. Heart: Normal rate and rhythm, s1 and s2 heart sounds present.  Lungs: Clear lung sounds in all lobes. Respirations equal and unlabored. Abdomen:  +BS, soft, non-tender and non-distended. No rebound or guarding. No HSM or masses noted. Neurologic:  Alert and  oriented x4 Psych:  Alert and cooperative. Normal mood and affect.  Invalid input(s): 6 MONTHS   ASSESSMENT: TUAN TIPPIN is a 73 y.o. male presenting today for follow up of GERD and globus sensation  Doing better on twice daily dosing of omeprazole  though continues to have some breakthrough, some globus sensation. Recommended changing timing of Omeprazole  with second dose prior to his last meal instead of after. He should update me in a few  weeks, can add famotidine at bedtime if symptoms not improving with timing change of PPI dosing. Recommended rescheduling EGD with Bravo placement (rule out true GERD vs. Esophageal hypersensitivity) when able, as his mother is currently terminally ill.   PLAN:  -continue omeprazole  40mg  BID -take evening dose about 30-45 minutes prior to last meal of the day -schedule EGD with Bravo placement when able -can add famotidine 20mg  at bedtime if no improvement with PPI timing change -good reflux precautions  All questions were answered, patient verbalized understanding and is in agreement with plan as outlined above.   Follow Up: 6 months   Xochilth Standish L. Leonela Kivi, MSN, APRN, AGNP-C Adult-Gerontology Nurse Practitioner [facility]

## 2023-09-26 NOTE — Patient Instructions (Addendum)
 Please take first dose of omeprazole  30-45 minutes prior to breakfast and second dose 30-45 minutes prior to supper (last meal of the day). Let me know how symptoms are doing over the next few weeks, as we may need to add in famotidine in the evenings for some added acid reflux support. Please let me know when you are ready to schedule EGD.  Be mindful greasy, spicy, fried, citrus foods, caffeine, carbonated drinks, chocolate and alcohol  as these can increase reflux symptoms Stay upright 2-3 hours after eating, prior to lying down and avoid eating late in the evenings.   Follow up 6 months  It was a pleasure to see you today. I want to create trusting relationships with patients and provide genuine, compassionate, and quality care. I truly value your feedback! please be on the lookout for a survey regarding your visit with me today. I appreciate your input about our visit and your time in completing this!    Tyrann Donaho L. Priti Consoli, MSN, APRN, AGNP-C Adult-Gerontology Nurse Practitioner [facility]

## 2023-10-12 ENCOUNTER — Other Ambulatory Visit (INDEPENDENT_AMBULATORY_CARE_PROVIDER_SITE_OTHER): Payer: Self-pay | Admitting: Gastroenterology

## 2023-10-12 DIAGNOSIS — K219 Gastro-esophageal reflux disease without esophagitis: Secondary | ICD-10-CM

## 2023-10-29 ENCOUNTER — Encounter (INDEPENDENT_AMBULATORY_CARE_PROVIDER_SITE_OTHER): Payer: Self-pay

## 2023-11-14 ENCOUNTER — Telehealth: Payer: Self-pay | Admitting: Urology

## 2023-11-14 NOTE — Telephone Encounter (Signed)
 Has appt in November but he has stones and would like to be seen sooner. He is hurting on his left side.

## 2023-11-15 NOTE — Telephone Encounter (Signed)
 Called pt to get details, pt states there's left side discomfort when he turns over on his back and discomfort on the left lower back pt advised a message will be sent to the provider to be addressed

## 2023-11-19 ENCOUNTER — Ambulatory Visit (HOSPITAL_COMMUNITY)
Admission: RE | Admit: 2023-11-19 | Discharge: 2023-11-19 | Disposition: A | Discharge status: Home / Self Care | Source: Ambulatory Visit | Attending: Urology | Admitting: Urology

## 2023-11-19 ENCOUNTER — Other Ambulatory Visit: Payer: Self-pay | Admitting: Urology

## 2023-11-19 DIAGNOSIS — N200 Calculus of kidney: Secondary | ICD-10-CM | POA: Insufficient documentation

## 2023-11-19 NOTE — Telephone Encounter (Signed)
 Called pt to notify him of MD Dahlstedt recommendations pt voiced his understanding also advised once we receive results MD will review and f/u pt voiced his understanding

## 2023-11-22 ENCOUNTER — Ambulatory Visit: Payer: Self-pay | Admitting: Urology

## 2023-12-18 ENCOUNTER — Encounter (INDEPENDENT_AMBULATORY_CARE_PROVIDER_SITE_OTHER): Payer: Self-pay | Admitting: Gastroenterology

## 2024-01-24 ENCOUNTER — Encounter: Payer: Self-pay | Admitting: Nurse Practitioner

## 2024-01-24 ENCOUNTER — Ambulatory Visit (INDEPENDENT_AMBULATORY_CARE_PROVIDER_SITE_OTHER): Payer: Self-pay | Admitting: Nurse Practitioner

## 2024-01-24 VITALS — BP 147/66 | HR 80 | Wt 226.0 lb

## 2024-01-24 DIAGNOSIS — R7309 Other abnormal glucose: Secondary | ICD-10-CM

## 2024-01-24 LAB — POCT GLYCOSYLATED HEMOGLOBIN (HGB A1C): Hemoglobin A1C: 6 % — AB (ref 4.0–5.6)

## 2024-01-24 MED ORDER — FLUTICASONE PROPIONATE 50 MCG/ACT NA SUSP: 1.0000 | Freq: Every day | NASAL | 1 refills | Status: AC

## 2024-01-24 MED ORDER — HALOBETASOL PROPIONATE 0.05 % EX CREA: 1.0000 | TOPICAL_CREAM | Freq: Two times a day (BID) | CUTANEOUS | 2 refills | Status: AC

## 2024-01-24 NOTE — Progress Notes (Signed)
 Subjective   Patient ID: Reginald Stephens, male    DOB: 09-Jul-1950, 73 y.o.   MRN: [id number]  Chief Complaint  Patient presents with   Establish Care   Diabetes   Hypertension   Weight Management Screening    Wanted to see why he's losing so much weight   Medication Refill    Flonase , halobetasol     Referring provider: Marvine Rush, MD  Reginald Stephens is a 73 y.o. male with Past Medical History: No date: Arthritis No date: Asthma No date: Chronic kidney disease No date: Diabetes mellitus No date: GERD (gastroesophageal reflux disease) No date: History of kidney stones No date: Hyperlipidemia No date: Hypertension No date: Palpitations   HPI  Patient was on the schedule to establish care today but he is currently a patient of Rush Marvine and would like to keep this provider as his PCP.  He was seen by Dr. Marvine last week.  Patient is followed by GI and urology and ophthalmology.  He does need refills on a couple of his medications today. Denies f/c/s, n/v/d, hemoptysis, PND, leg swelling Denies chest pain or edema      Allergies  Allergen Reactions   Shellfish Allergy Nausea And Vomiting    Immunization History  Administered Date(s) Administered   PFIZER(Purple Top)SARS-COV-2 Vaccination 04/26/2019, 05/20/2019    Tobacco History: Social History   Tobacco Use  Smoking Status Former   Current packs/day: 0.00   Average packs/day: 1 pack/day for 25.0 years (25.0 ttl pk-yrs)   Types: Cigarettes   Start date: 03/19/1992   Quit date: 03/19/2017   Years since quitting: 6.8  Smokeless Tobacco Never   Counseling given: Not Answered   Outpatient Encounter Medications as of 01/24/2024  Medication Sig   aspirin  EC 81 MG tablet Take 81 mg by mouth daily.   atorvastatin  (LIPITOR) 20 MG tablet Take 20 mg by mouth daily.   Cholecalciferol (VITAMIN D3) 50000 units CAPS Take 50,000 Units by mouth once a week. Friday   ferrous sulfate  325 (65 FE) MG tablet Take 325  mg by mouth daily with breakfast.   JANUVIA 100 MG tablet Take 100 mg by mouth daily.   lisinopril -hydrochlorothiazide  (PRINZIDE ,ZESTORETIC ) 20-25 MG per tablet Take 1 tablet by mouth daily.    Magnesium  (CVS TRIPLE MAGNESIUM  COMPLEX) 400 MG CAPS Take 1 capsule by mouth daily.   metFORMIN (GLUCOPHAGE) 1000 MG tablet Take 1,000 mg by mouth 2 (two) times daily with a meal.   omeprazole  (PRILOSEC) 40 MG capsule TAKE 1 CAPSULE BY MOUTH TWICE A DAY   prednisoLONE  acetate (PRED FORTE ) 1 % ophthalmic suspension Place 1 drop into the left eye 2 (two) times daily.   sodium chloride  (MURO 128) 5 % ophthalmic ointment Place 1 Application into the left eye at bedtime as needed.   TIADYLT  ER 300 MG 24 hr capsule Take 300 mg by mouth daily.   valACYclovir (VALTREX) 1000 MG tablet Take 1,000 mg by mouth daily.   [DISCONTINUED] halobetasol  (ULTRAVATE ) 0.05 % cream Apply 1 Tube topically 2 (two) times daily.   fluticasone  (FLONASE ) 50 MCG/ACT nasal spray Place 1 spray into both nostrils daily.   halobetasol  (ULTRAVATE ) 0.05 % cream Apply 1 Tube topically 2 (two) times daily.   [DISCONTINUED] fluticasone  (FLONASE ) 50 MCG/ACT nasal spray Place 1 spray into both nostrils daily.  (Patient not taking: Reported on 01/24/2024)   No facility-administered encounter medications on file as of 01/24/2024.    Review of Systems  Review of Systems  Constitutional: Negative.   HENT: Negative.    Cardiovascular: Negative.   Gastrointestinal: Negative.   Allergic/Immunologic: Negative.   Neurological: Negative.   Psychiatric/Behavioral: Negative.       Objective:   BP (!) 147/66 (BP Location: Left Arm, Patient Position: Sitting, Cuff Size: Large)   Pulse 80   Wt 226 lb (102.5 kg)   SpO2 94%   BMI 32.43 kg/m   Wt Readings from Last 5 Encounters:  01/24/24 226 lb (102.5 kg)  09/26/23 227 lb 6.4 oz (103.1 kg)  04/11/23 220 lb 7.4 oz (100 kg)  01/22/23 242 lb 9.6 oz (110 kg)  04/12/22 236 lb (107 kg)      Physical Exam Vitals and nursing note reviewed.  Constitutional:      General: He is not in acute distress.    Appearance: He is well-developed.  Cardiovascular:     Rate and Rhythm: Normal rate and regular rhythm.  Pulmonary:     Effort: Pulmonary effort is normal.     Breath sounds: Normal breath sounds.  Skin:    General: Skin is warm and dry.  Neurological:     Mental Status: He is alert and oriented to person, place, and time.       Assessment & Plan:   Elevated glucose level -     POCT glycosylated hemoglobin (Hb A1C)  Other orders -     Halobetasol  Propionate; Apply 1 Tube topically 2 (two) times daily.  Dispense: 50 g; Refill: 2 -     Fluticasone  Propionate; Place 1 spray into both nostrils daily.  Dispense: 12.9 mL; Refill: 1     Return if symptoms worsen or fail to improve.   Bascom GORMAN Borer, NP 01/24/2024

## 2024-01-27 NOTE — Progress Notes (Unsigned)
 History of Present Illness: Here for followup of urolithiasis.    Initial/last stone treatment was with lithotripsy years ago by Dr. Aleene.  Since his last visit here a year ago he has not had any symptoms of stone.  No blood in his urine, no lower urinary tract symptoms, no flank pain.  He does try to increase fluid intake and add some lemon juice once in a while to the diet.  11.25.2025:  Past Medical History:  Diagnosis Date   Arthritis    Asthma    Chronic kidney disease    Diabetes mellitus    GERD (gastroesophageal reflux disease)    History of kidney stones    Hyperlipidemia    Hypertension    Palpitations     Past Surgical History:  Procedure Laterality Date   COLONOSCOPY  02/15/2011   Procedure: COLONOSCOPY;  Surgeon: Claudis RAYMOND Rivet, MD;  [facility];  Service: Endoscopy;  Laterality: N/A;   COLONOSCOPY N/A 09/23/2019   Procedure: COLONOSCOPY;  Surgeon: Rivet Claudis RAYMOND, MD;  [facility];  Service: Endoscopy;  Laterality: N/A;  730   CYSTOSCOPY  2011   ESOPHAGEAL DILATION N/A 07/08/2014   Procedure: ESOPHAGEAL DILATION;  Surgeon: Claudis RAYMOND Rivet, MD;  [facility];  Service: Endoscopy;  Laterality: N/A;   ESOPHAGOGASTRODUODENOSCOPY N/A 07/08/2014   Procedure: ESOPHAGOGASTRODUODENOSCOPY (EGD);  Surgeon: Claudis RAYMOND Rivet, MD;  [facility];  Service: Endoscopy;  Laterality: N/A;  245   ORIF ANKLE FRACTURE Left 03/19/2017   Procedure: OPEN REDUCTION INTERNAL FIXATION (ORIF) LEFT ANKLE FRACTURE;  Surgeon: Margrette Taft BRAVO, MD;  Location: [facility];  Service: Orthopedics;  Laterality: Left;   TONSILLECTOMY     age 73   TONSILLECTOMY      Home Medications:  Allergies as of 01/28/2024       Reactions   Shellfish Allergy Nausea And Vomiting        Medication List        Accurate as of January 27, 2024 12:08 PM. If you have any questions, ask your nurse or doctor.          aspirin  EC 81 MG tablet Take 81 mg by mouth  daily.   atorvastatin  20 MG tablet Commonly known as: LIPITOR Take 20 mg by mouth daily.   CVS Triple Magnesium  Complex 400 MG Caps Generic drug: Magnesium  Take 1 capsule by mouth daily.   ferrous sulfate  325 (65 FE) MG tablet Take 325 mg by mouth daily with breakfast.   fluticasone  50 MCG/ACT nasal spray Commonly known as: FLONASE  Place 1 spray into both nostrils daily.   halobetasol  0.05 % cream Commonly known as: ULTRAVATE  Apply 1 Tube topically 2 (two) times daily.   Januvia 100 MG tablet Generic drug: sitaGLIPtin Take 100 mg by mouth daily.   lisinopril -hydrochlorothiazide  20-25 MG tablet Commonly known as: ZESTORETIC  Take 1 tablet by mouth daily.   metFORMIN 1000 MG tablet Commonly known as: GLUCOPHAGE Take 1,000 mg by mouth 2 (two) times daily with a meal.   omeprazole  40 MG capsule Commonly known as: PRILOSEC TAKE 1 CAPSULE BY MOUTH TWICE A DAY   prednisoLONE  acetate 1 % ophthalmic suspension Commonly known as: PRED FORTE  Place 1 drop into the left eye 2 (two) times daily.   sodium chloride  5 % ophthalmic ointment Commonly known as: MURO 128 Place 1 Application into the left eye at bedtime as needed.   Tiadylt  ER 300 MG 24 hr capsule Generic drug: diltiazem  Take  300 mg by mouth daily.   valACYclovir 1000 MG tablet Commonly known as: VALTREX Take 1,000 mg by mouth daily.   Vitamin D3 1.25 MG (50000 UT) Caps Take 50,000 Units by mouth once a week. Friday        Allergies:  Allergies  Allergen Reactions   Shellfish Allergy Nausea And Vomiting    Family History  Problem Relation Age of Onset   Heart attack Maternal Grandmother    Stroke Maternal Grandfather     Social History:  reports that he quit smoking about 6 years ago. His smoking use included cigarettes. He started smoking about 31 years ago. He has a 25 pack-year smoking history. He has never used smokeless tobacco. He reports current alcohol  use of about 7.0 standard drinks of  alcohol  per week. He reports that he does not use drugs.  ROS: A complete review of systems was performed.  All systems are negative except for pertinent findings as noted.  Physical Exam:  Vital signs in last 24 hours: There were no vitals taken for this visit. Constitutional:  Alert and oriented, No acute distress Cardiovascular: Regular rate  Respiratory: Normal respiratory effort Neurologic: Grossly intact, no focal deficits Psychiatric: Normal mood and affect  I have reviewed prior pt notes  I have reviewed urinalysis results--clear  I have independently reviewed prior imaging--KUB from 04/05/2020 and 2023 and CT scan from 2018 reviewed with the patient.  Prior AUS notes reviewed    Impression/Assessment:  Left lower pole renal calculi, large but stable in size, asymptomatic.  Plan:  I will send him out for KUB  Dietary instructions given  I will see back in 1 year for check

## 2024-01-28 ENCOUNTER — Ambulatory Visit: Payer: Medicare HMO | Admitting: Urology

## 2024-01-28 VITALS — BP 112/58 | HR 67 | Wt 229.0 lb

## 2024-01-28 DIAGNOSIS — N400 Enlarged prostate without lower urinary tract symptoms: Secondary | ICD-10-CM | POA: Diagnosis not present

## 2024-01-28 DIAGNOSIS — N200 Calculus of kidney: Secondary | ICD-10-CM | POA: Diagnosis not present

## 2024-01-28 DIAGNOSIS — N5201 Erectile dysfunction due to arterial insufficiency: Secondary | ICD-10-CM

## 2024-01-28 LAB — MICROSCOPIC EXAMINATION: Bacteria, UA: NONE SEEN

## 2024-01-28 LAB — URINALYSIS, ROUTINE W REFLEX MICROSCOPIC
Bilirubin, UA: NEGATIVE
Glucose, UA: NEGATIVE
Ketones, UA: NEGATIVE
Nitrite, UA: NEGATIVE
Protein,UA: NEGATIVE
RBC, UA: NEGATIVE
Specific Gravity, UA: 1.02 (ref 1.005–1.030)
Urobilinogen, Ur: 2 mg/dL — ABNORMAL HIGH (ref 0.2–1.0)
pH, UA: 6 (ref 5.0–7.5)

## 2024-01-29 ENCOUNTER — Telehealth: Payer: Self-pay

## 2024-01-29 ENCOUNTER — Ambulatory Visit: Payer: Self-pay | Admitting: Urology

## 2024-01-29 LAB — PSA: Prostate Specific Ag, Serum: 3.8 ng/mL (ref 0.0–4.0)

## 2024-01-29 NOTE — Telephone Encounter (Signed)
 Patient called concern about PSA and abnormal reading through [facility], had Dr. Sherrilee review PSA, verbal from MD making pt aware PSA is normal and anything under 4 is normal. Pt is also made aware that Dr. Matilda review urinalysis before going into the room with pt and his UA was normal. Pt voiced understanding

## 2024-03-12 ENCOUNTER — Ambulatory Visit: Admitting: Orthopedic Surgery

## 2024-03-12 ENCOUNTER — Encounter: Payer: Self-pay | Admitting: Orthopedic Surgery

## 2024-03-12 ENCOUNTER — Other Ambulatory Visit

## 2024-03-12 VITALS — Ht 70.0 in | Wt 229.0 lb

## 2024-03-12 DIAGNOSIS — G8929 Other chronic pain: Secondary | ICD-10-CM

## 2024-03-12 DIAGNOSIS — M75111 Incomplete rotator cuff tear or rupture of right shoulder, not specified as traumatic: Secondary | ICD-10-CM | POA: Diagnosis not present

## 2024-03-12 DIAGNOSIS — M25511 Pain in right shoulder: Secondary | ICD-10-CM | POA: Diagnosis not present

## 2024-03-12 DIAGNOSIS — S46011D Strain of muscle(s) and tendon(s) of the rotator cuff of right shoulder, subsequent encounter: Secondary | ICD-10-CM

## 2024-03-12 DIAGNOSIS — E119 Type 2 diabetes mellitus without complications: Secondary | ICD-10-CM | POA: Insufficient documentation

## 2024-03-12 MED ORDER — METHYLPREDNISOLONE ACETATE 40 MG/ML IJ SUSP
40.0000 mg | Freq: Once | INTRAMUSCULAR | Status: AC
  Administered 2024-03-12: 40 mg via INTRA_ARTICULAR

## 2024-03-12 NOTE — Progress Notes (Signed)
" ° °  Patient: Reginald Stephens           Date of Birth: 1951-02-24           MRN: [id number] Visit Date: 03/12/2024 Requested by: Marvine Rush, MD [address] [place],  [place] [address] PCP: Marvine Rush, MD  Encounter Diagnoses  Name Primary?   Traumatic incomplete tear of right rotator cuff, subsequent encounter Yes   Chronic right shoulder pain     Assessment and plan:  Keatin has a partially torn rotator cuff diagnosed by MRI about a year ago after a fall.  He has increasing shoulder pain and decreasing range of motion and maybe a little bit of weakness  He could have a rotator cuff tear extension or just bursitis  We gave him a subacromial injection recheck in 2 weeks May have to repeat MRI  Follow-up in 2 weeks  Meds ordered this encounter  Medications   methylPREDNISolone  acetate (DEPO-MEDROL ) injection 40 mg     Chief Complaint  Patient presents with   Shoulder Pain    Right     History:  Last year Aero fell and tore his rotator cuff.  We diagnosed him and confirmed with MRI he did have physical therapy.  Therapy actually made his shoulder worse and once he stopped it the shoulder tended to get better.  He was in his normal state of health until about a few weeks ago and the shoulder started hurting again  Focused exam findings:  He has pain with empty can maneuver but he has 5 out of 5 strength I do notice that he has decreased external rotation only 35 degrees he has pain with forward elevation and is Neer sign for impingement was mildly positive  DG Shoulder Right Result Date: 03/12/2024 History of rotator cuff tear Worsening right shoulder pain X-ray AP and lateral Inferior glenoid osteophyte narrowing sclerosis AC joint Impression mild glenohumeral arthritis moderate AC joint arthritis, humeral head height normal     Procedure note the subacromial injection shoulder RIGHT    Verbal consent was obtained to inject the  RIGHT   Shoulder  Timeout was  completed to confirm the injection site is a subacromial space of the  RIGHT  shoulder   Medication used Depo-Medrol  40 mg and lidocaine  1% 3 cc  Anesthesia was provided by ethyl chloride  The injection was performed in the RIGHT  posterior subacromial space. After pinning the skin with alcohol  and anesthetized the skin with ethyl chloride the subacromial space was injected using a 20-gauge needle. There were no complications  Sterile dressing was applied.    "

## 2024-03-12 NOTE — Progress Notes (Signed)
" ° ° °  03/12/2024   Chief Complaint  Patient presents with   Shoulder Pain    Right     No diagnosis found.  What pharmacy do you use ? [address] [address]_  DOI/DOS/ Date: over a year ago fell/ had MRI   Did you get better, worse or no change (Answer below)   Worse  went for the physical therapy made his shoulder worse  Taking Ibuprofen  some       "

## 2024-03-19 ENCOUNTER — Encounter (INDEPENDENT_AMBULATORY_CARE_PROVIDER_SITE_OTHER): Payer: Self-pay | Admitting: Gastroenterology

## 2024-03-26 ENCOUNTER — Encounter: Payer: Self-pay | Admitting: Orthopedic Surgery

## 2024-03-26 ENCOUNTER — Ambulatory Visit: Admitting: Orthopedic Surgery

## 2024-03-26 DIAGNOSIS — M25511 Pain in right shoulder: Secondary | ICD-10-CM

## 2024-03-26 DIAGNOSIS — G8929 Other chronic pain: Secondary | ICD-10-CM | POA: Diagnosis not present

## 2024-03-26 DIAGNOSIS — S46011D Strain of muscle(s) and tendon(s) of the rotator cuff of right shoulder, subsequent encounter: Secondary | ICD-10-CM | POA: Diagnosis not present

## 2024-03-26 NOTE — Progress Notes (Signed)
" ° ° °  03/26/2024   Chief Complaint  Patient presents with   Shoulder Pain    Right     No diagnosis found.  What pharmacy do you use ? _____[facility] ______________________  DOI/DOS/ Date: ongoing  Did you get better, worse or no change (Answer below)         "

## 2024-03-26 NOTE — Progress Notes (Signed)
 FOLLOW-UP OFFICE VISIT   Patient: Reginald Stephens           Date of Birth: 04/23/1950           MRN: [id number] Visit Date: 03/26/2024 Requested by: Marvine Rush, MD [address] [place],  [place] [address] PCP: Marvine Rush, MD    Encounter Diagnoses  Name Primary?   Chronic right shoulder pain Yes   Traumatic incomplete tear of right rotator cuff, subsequent encounter     Chief Complaint  Patient presents with   Shoulder Pain    Right     Reginald Stephens is 74 years old he has a partially torn rotator cuff diagnosed by MRI approximately 1 year ago after a fall.  He is status post injection subacromial space and is reported significant improvement in his pain.  He still has some popping when he raises his arm in the air and some soreness around the deltoid but he says now of the pain is tolerable and he gets good relief with Tylenol  arthritis  Shoulder Pain    He continues to have good forward elevation of his right shoulder and arm with minimal discomfort  ASSESSMENT AND PLAN Encounter Diagnoses  Name Primary?   Chronic right shoulder pain Yes   Traumatic incomplete tear of right rotator cuff, subsequent encounter    We can certainly repeat the injection if needed.  In the meantime he will continue with the Tylenol  arthritis until symptoms warrant repeat injection

## 2025-02-05 ENCOUNTER — Ambulatory Visit: Admitting: Urology
